# Patient Record
Sex: Female | Born: 1945 | Hispanic: No | Smoking: Former smoker
Health system: Southern US, Community
[De-identification: ages and names within clinical notes are randomized; demographics above are authoritative.]

## PROBLEM LIST (undated history)

## (undated) DIAGNOSIS — K5900 Constipation, unspecified: Secondary | ICD-10-CM

## (undated) DIAGNOSIS — K219 Gastro-esophageal reflux disease without esophagitis: Secondary | ICD-10-CM

## (undated) HISTORY — PX: BACK SURGERY: SHX140

## (undated) HISTORY — PX: CHOLECYSTECTOMY: SHX55

---

## 1997-10-08 ENCOUNTER — Ambulatory Visit (HOSPITAL_COMMUNITY): Admission: RE | Admit: 1997-10-08 | Discharge: 1997-10-08 | Payer: Self-pay | Admitting: Family Medicine

## 1999-01-28 ENCOUNTER — Other Ambulatory Visit: Admission: RE | Admit: 1999-01-28 | Discharge: 1999-01-28 | Payer: Self-pay | Admitting: Family Medicine

## 2000-02-02 ENCOUNTER — Other Ambulatory Visit: Admission: RE | Admit: 2000-02-02 | Discharge: 2000-02-02 | Payer: Self-pay | Admitting: Family Medicine

## 2000-03-09 ENCOUNTER — Encounter: Admission: RE | Admit: 2000-03-09 | Discharge: 2000-03-09 | Payer: Self-pay | Admitting: Family Medicine

## 2000-03-09 ENCOUNTER — Encounter: Payer: Self-pay | Admitting: Family Medicine

## 2001-04-01 ENCOUNTER — Encounter: Payer: Self-pay | Admitting: Family Medicine

## 2001-04-01 ENCOUNTER — Encounter: Admission: RE | Admit: 2001-04-01 | Discharge: 2001-04-01 | Payer: Self-pay | Admitting: Family Medicine

## 2002-04-02 ENCOUNTER — Encounter: Admission: RE | Admit: 2002-04-02 | Discharge: 2002-04-02 | Payer: Self-pay | Admitting: Family Medicine

## 2002-04-02 ENCOUNTER — Encounter: Payer: Self-pay | Admitting: Family Medicine

## 2003-03-31 ENCOUNTER — Other Ambulatory Visit: Admission: RE | Admit: 2003-03-31 | Discharge: 2003-03-31 | Payer: Self-pay | Admitting: Family Medicine

## 2004-03-08 ENCOUNTER — Encounter: Admission: RE | Admit: 2004-03-08 | Discharge: 2004-03-08 | Payer: Self-pay | Admitting: Family Medicine

## 2004-06-17 ENCOUNTER — Ambulatory Visit (HOSPITAL_COMMUNITY): Admission: RE | Admit: 2004-06-17 | Discharge: 2004-06-17 | Payer: Self-pay | Admitting: Gastroenterology

## 2004-12-18 ENCOUNTER — Emergency Department (HOSPITAL_COMMUNITY): Admission: EM | Admit: 2004-12-18 | Discharge: 2004-12-18 | Payer: Self-pay | Admitting: Family Medicine

## 2005-02-14 ENCOUNTER — Encounter: Admission: RE | Admit: 2005-02-14 | Discharge: 2005-02-14 | Payer: Self-pay | Admitting: Family Medicine

## 2005-04-17 ENCOUNTER — Other Ambulatory Visit: Admission: RE | Admit: 2005-04-17 | Discharge: 2005-04-17 | Payer: Self-pay | Admitting: Family Medicine

## 2005-09-28 ENCOUNTER — Encounter: Admission: RE | Admit: 2005-09-28 | Discharge: 2005-09-28 | Payer: Self-pay | Admitting: Family Medicine

## 2005-10-05 ENCOUNTER — Encounter: Admission: RE | Admit: 2005-10-05 | Discharge: 2005-10-05 | Payer: Self-pay | Admitting: Family Medicine

## 2006-04-05 ENCOUNTER — Encounter: Admission: RE | Admit: 2006-04-05 | Discharge: 2006-04-05 | Payer: Self-pay | Admitting: Family Medicine

## 2006-12-17 ENCOUNTER — Encounter: Admission: RE | Admit: 2006-12-17 | Discharge: 2006-12-17 | Payer: Self-pay | Admitting: Family Medicine

## 2007-05-06 ENCOUNTER — Ambulatory Visit (HOSPITAL_COMMUNITY): Admission: RE | Admit: 2007-05-06 | Discharge: 2007-05-06 | Payer: Self-pay | Admitting: Family Medicine

## 2007-11-04 ENCOUNTER — Emergency Department (HOSPITAL_COMMUNITY): Admission: EM | Admit: 2007-11-04 | Discharge: 2007-11-04 | Payer: Self-pay | Admitting: Family Medicine

## 2007-12-18 ENCOUNTER — Emergency Department (HOSPITAL_COMMUNITY): Admission: EM | Admit: 2007-12-18 | Discharge: 2007-12-18 | Payer: Self-pay | Admitting: Emergency Medicine

## 2007-12-19 ENCOUNTER — Emergency Department (HOSPITAL_COMMUNITY): Admission: EM | Admit: 2007-12-19 | Discharge: 2007-12-19 | Payer: Self-pay | Admitting: Emergency Medicine

## 2007-12-30 ENCOUNTER — Encounter: Admission: RE | Admit: 2007-12-30 | Discharge: 2008-01-21 | Payer: Self-pay | Admitting: Neurosurgery

## 2008-01-27 ENCOUNTER — Inpatient Hospital Stay (HOSPITAL_COMMUNITY): Admission: RE | Admit: 2008-01-27 | Discharge: 2008-01-29 | Payer: Self-pay | Admitting: Neurosurgery

## 2008-03-09 ENCOUNTER — Ambulatory Visit (HOSPITAL_COMMUNITY): Admission: RE | Admit: 2008-03-09 | Discharge: 2008-03-09 | Payer: Self-pay | Admitting: Orthopedic Surgery

## 2008-03-11 ENCOUNTER — Encounter: Admission: RE | Admit: 2008-03-11 | Discharge: 2008-03-11 | Payer: Self-pay | Admitting: Family Medicine

## 2008-04-24 ENCOUNTER — Other Ambulatory Visit: Admission: RE | Admit: 2008-04-24 | Discharge: 2008-04-24 | Payer: Self-pay | Admitting: Family Medicine

## 2008-12-24 ENCOUNTER — Ambulatory Visit: Payer: Self-pay | Admitting: Vascular Surgery

## 2009-03-30 ENCOUNTER — Encounter: Admission: RE | Admit: 2009-03-30 | Discharge: 2009-03-30 | Payer: Self-pay | Admitting: Family Medicine

## 2009-06-17 ENCOUNTER — Ambulatory Visit (HOSPITAL_COMMUNITY): Admission: RE | Admit: 2009-06-17 | Discharge: 2009-06-17 | Payer: Self-pay | Admitting: Neurosurgery

## 2009-07-28 ENCOUNTER — Ambulatory Visit (HOSPITAL_COMMUNITY): Admission: RE | Admit: 2009-07-28 | Discharge: 2009-07-28 | Payer: Self-pay | Admitting: Neurosurgery

## 2010-02-27 ENCOUNTER — Encounter: Payer: Self-pay | Admitting: Family Medicine

## 2010-04-26 LAB — CREATININE, SERUM
Creatinine, Ser: 0.81 mg/dL (ref 0.4–1.2)
GFR calc Af Amer: >60 mL/min (ref >60)
GFR calc non Af Amer: >60 mL/min (ref >60)

## 2010-06-21 NOTE — Procedures (Signed)
DUPLEX DEEP VENOUS EXAM - LOWER EXTREMITY   INDICATION:  Right thigh lump and pain.   HISTORY:  Edema:  No.  Trauma/Surgery:  No.  Pain:  Yes.  PE:  No.  Previous DVT:  No.  Anticoagulants:  None.  Other:   DUPLEX EXAM:                CFV   SFV   PopV  PTV    GSV                R  L  R  L  R  L  R   L  R  L  Thrombosis    o  o  o     o     o      o  Spontaneous   +  +  +     +     +      +  Phasic        +  +  +     +     +      +  Augmentation  +  +  +     +     +      +  Compressible  +  +  +     +     +      +  Competent     +  +  +     +     +      +   Legend:  + - yes  o - no  p - partial  D - decreased   IMPRESSION:  No evidence of deep venous thrombosis noted in the right  leg.    _____________________________  Di Kindle. Edilia Bo, M.D.   MG/MEDQ  D:  12/24/2008  T:  12/24/2008  Job:  [id number]

## 2010-06-21 NOTE — Op Note (Signed)
[name], [name]                ACCOUNT NO.:  [id number]   MEDICAL RECORD NO.:  [id number]          PATIENT TYPE:  INP   LOCATION:  3016                         FACILITY:  [facility]   PHYSICIAN:  Hewitt Shorts, M.D.DATE OF BIRTH:  04-06-1945   DATE OF PROCEDURE:  01/27/2008  DATE OF DISCHARGE:                               OPERATIVE REPORT   PREOPERATIVE DIAGNOSES:  1. Left L3-L4 extraforaminal lumbar disk herniation.  2. L3-L4 dynamic degenerative spondylolisthesis grade 1.  3. Lumbar spondylosis.  4. Lumbar radiculopathy.   POSTOPERATIVE DIAGNOSES:  1. Left L3-L4 extraforaminal lumbar disc herniation.  2. L3-L4 dynamic degenerative spondylolisthesis grade 1.  3. Lumbar spondylosis.  4. Lumbar radiculopathy.   PROCEDURE:  Bilateral L3-L4 lumbar laminotomy, facetectomy,  foraminotomy, and microdiskectomy with decompression of the thecal sac  and exiting L3 and L4 nerve roots bilaterally, with decompression beyond  that required for interbody fusion.  Bilateral L3-L4 posterior lumbar  interbody fusion with mosaic with bone marrow aspirate and bilateral L3-  L4 posterolateral arthrodesis with Radius posterior instrumentation and  mosaic with bone marrow aspirate with microdissection.   SURGEON:  Hewitt Shorts, MD   ASSISTANTS:  Webb Silversmith, NP and Channing Mutters, MD   ANESTHESIA:  General endotracheal.   INDICATIONS:  The patient is a 65 year old woman who presented with a  disabling left lumbar radiculopathy, substantial for left L3-L4  extraforaminal lumbar disk herniation with compression of the exiting  left L3 nerve root and weakness of the left iliopsoas and quadriceps.  She also has a dynamic degenerative spondylolisthesis due to her  transverse facet arthropathy, and the decision was made to proceed with  decompression and stabilization.   PROCEDURE:  The patient was brought to the operating room and placed  under general endotracheal anesthesia.  The patient was turned to  a  prone position.  Lumbar region was prepped with Betadine soap and  solution, and draped in a sterile fashion.  The midline was infiltrated  with local anesthetic with epinephrine.  X-ray was taken.  The L3-L4  level identified, and a midline incision made of L3-L4 level is carried  down through the subcutaneous tissue.  Bipolar cautery and  electrocautery were used to maintain hemostasis.  Dissection was carried  down to the lumbar fascia, which was incised bilaterally, and the  paraspinal muscles were dissected from the spinous process and lamina in  a subperiosteal fashion.  Another x-ray was taken, L3-L4 level  identified.  With magnification using microdissection and microsurgical  technique, we proceed with the decompression.  Bilateral laminotomy and  facetectomy were performed using the XMax drill and Kerrison punches.  Ligamentum flavum was carefully removed, and we then identified thecal  sac and exiting L3 and L4 nerve roots bilaterally.  The dissection was  carried out laterally to the left.  The annulus was identified, and the  left L3 nerve root was identified.  We transected the extraforaminal  soft tissues around the annulus and began to identify free fragment disk  herniation compressing the left L3 nerve root.  This was gently  mobilized and removed in a  piecemeal fashion, and in fact, a large free  fragment of disk herniation extraforaminally was dissected from the  surrounding tissues and removed with good decompression of the exiting  left L3 nerve root.   We then proceeded with the bilateral diskectomy incising the annulus  bilaterally, removing spondylitic overgrowth from the posterior aspect  of the vertebral bodies bilaterally and then continued diskectomy using  microcurettes and pituitary rongeurs.  Thorough diskectomy was performed  both in the midline as well laterally, and then, we prepared the  endplates using a gradually increasing size of paddle curette  to remove  the cartilaginous surface.  Once we were down to a good bony surface, we  measured the height of the intervertebral disk space reflected to 13 x  25 mm implants.   We then draped the C-arm fluoroscope and brought into the field and  localized the left L4 pedicle.  It was probed with a pedicle probe, and  bone morrow aspirate was aspirated from the vertebral body.  We injected  this over 15-mL strip of mosaic.  Each of the interbody implants was  then packed with mosaic with bone morrow aspirate.  We carefully  retracted thecal sac and nerve root and then placed the first implant on  the right side.  It was countersunk; then from the left side, we packed  additional mosaic with bone morrow aspirate in the midline and then  again retracted the thecal sac and nerve root.  We placed the second  implant on the left side.  We packed additional mosaic with bone morrow  aspirate laterally to the implants.  Once the interbody fusion was done,  we proceeded with the posterolateral arthrodesis.   Again using the C-arm fluoroscope, the right L4 pedicle as well as the  bilateral L3 pedicles were identified.  Each of the 4 pedicles was  examined with ball probe, good bony surface found.  No cutouts were  found.  Each of the pedicles were tapped with 5.25 mm tap and again  examined with the ball probe.  Good threading was noted.  No cutouts  were found, and then we placed 5.75 x 45 mm screws bilaterally in each  level.  Once all 4 screws were in place, we selected a 45-mm rod for the  right side and 50-mm rod for the left side.  They were placed within the  screw heads and secured with locking caps.  Once all 4 locking caps were  in place, final tightening was done against the counter torque.  At this  time, we decorticated the transverse processes at L3 and L4 bilaterally,  and we packed the remaining mosaic with bone marrow aspirate along with  a small Infuse, placing half of the Infuse  on the left side, the other  half on the right side along with the remaining mosaic with bone morrow  aspirates over the transverse process and intertransverse space.  We  carefully examined to ensure that none of the bone graft material came  into the spinal canal or neuroforamen, and there was no encroachment on  the thecal sac or nerve roots bilaterally.  Once the bone graft was in  place, we proceed with closure.  The wound had been irrigated numerous  times throughout the procedure with saline solution as well as  bacitracin solution.  The paraspinal muscles were approximated with  interrupted undyed #1 Vicryl sutures.  The deep fascia was closed with  interrupted undyed #1 Vicryl sutures.  The  subcutaneous and subcuticular  were closed with interrupted inverted 2-0 and 3-0 undyed Vicryl sutures,  and the skin was approximated with Dermabond.  The wound was dressed  with Adaptic and sterile gauze.  The procedure was tolerated well.  The  estimated blood loss was 100 mL.  We did use a Cell Saver during the  procedure, but there was insufficient blood loss to  process the  collected blood within the Cell Saver.  Sponge and needle count were  correct.  Following surgery, the patient was turned back to the supine  position to be reversed from the anesthetic, extubated, and transferred  to the recovery room for further care.      Hewitt Shorts, M.D.  Electronically Signed    RWN/MEDQ  D:  01/27/2008  T:  01/28/2008  Job:  [id number]

## 2010-06-24 NOTE — Op Note (Signed)
NAME:  [name], [name]                ACCOUNT NO.:  [id number]   MEDICAL RECORD NO.:  [id number]          PATIENT TYPE:  AMB   LOCATION:  ENDO                         FACILITY:  [facility]   PHYSICIAN:  Graylin Shiver, M.D.   DATE OF BIRTH:  [birth year]   DATE OF PROCEDURE:  06/17/2004  DATE OF DISCHARGE:                                 OPERATIVE REPORT   PROCEDURE:  Colonoscopy.   INDICATION:  Family history of colon polyps in the patient's grandmother.   Informed consent was obtained after explanation of the risks of bleeding,  infection. and perforation.   PREMEDICATION:  1.  Fentanyl 75 mcg IV.  2.  Versed 7 mg IV.   PROCEDURE:  With the patient in the left lateral decubitus position, a  rectal exam was performed.  No masses were felt.  The Olympus colonoscope  was inserted into the rectum and advanced around the colon to the cecum.  Cecal landmarks were identified.  The cecum and ascending colon were normal.  The transverse colon normal.  The descending colon, sigmoid, and rectum were  normal.  She tolerated the procedure well without complications.   IMPRESSION:  Normal colonoscopy to the cecum.      SFG/MEDQ  D:  06/17/2004  T:  06/17/2004  Job:  [id number]   cc:   Bryan Lemma. Manus Gunning, M.D.  [address] [place]  [place] [address]  Fax: [phone]

## 2010-06-27 ENCOUNTER — Other Ambulatory Visit: Payer: Self-pay | Admitting: Family Medicine

## 2010-06-27 DIAGNOSIS — Z1231 Encounter for screening mammogram for malignant neoplasm of breast: Secondary | ICD-10-CM

## 2010-07-06 ENCOUNTER — Ambulatory Visit
Admission: RE | Admit: 2010-07-06 | Discharge: 2010-07-06 | Disposition: A | Discharge status: Home / Self Care | Payer: 59 | Source: Ambulatory Visit | Attending: Family Medicine | Admitting: Family Medicine

## 2010-07-06 DIAGNOSIS — Z1231 Encounter for screening mammogram for malignant neoplasm of breast: Secondary | ICD-10-CM

## 2010-07-15 IMAGING — CT CT L SPINE W/ CM
2 of 13 series · 3 of 20 positions shown, 4 images · IV contrast (omnipaque)
Comparison: Lumbar MRI 06/17/2009. CT 06/17/2009.

MYELOGRAM LUMBAR

CLINICAL DATA: 64-year-old female status post L3-L4 fusion in 0113.
Subsequent development of pain radiating down the medial left lower
extremity.  Question L4 versus L5 radiculitis.
TECHNIQUE: Intrathecal contrast was administered by Dr. Jaylon
Jim  via lumbar puncture at the L1-L2 level. Following
injection of intrathecal Omnipaque contrast, spine imaging in
multiple projections was performed using fluoroscopy.

Fluoroscopy Time: 2.5 minutes.
TECHNIQUE: CT imaging of the lumbar spine was performed after
intrathecal contrast administration.  Multiplanar CT image
reconstructions were also generated.

[Series 104: coronal l-spine · coronal · 0.43mm/px · 1 of 52 slices shown]
[im 26/52  bone]
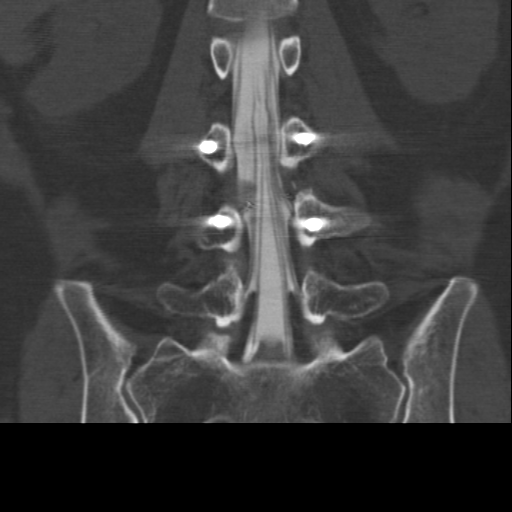

[Series 105: axial l-spine · axial · 0.27mm/px · z∈[-403,-172]mm · 2 of 79 slices shown, 3 images]
[im 1/79  soft-tissue]
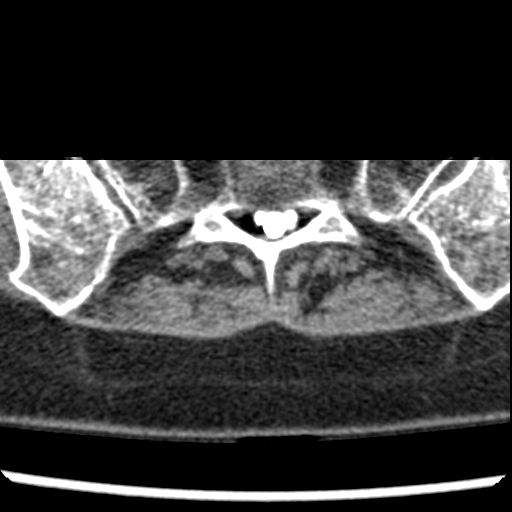
[im 1/79  bone]
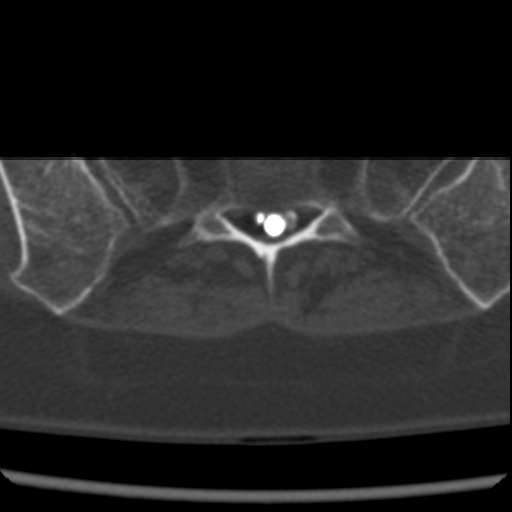
[im 79/79  bone]
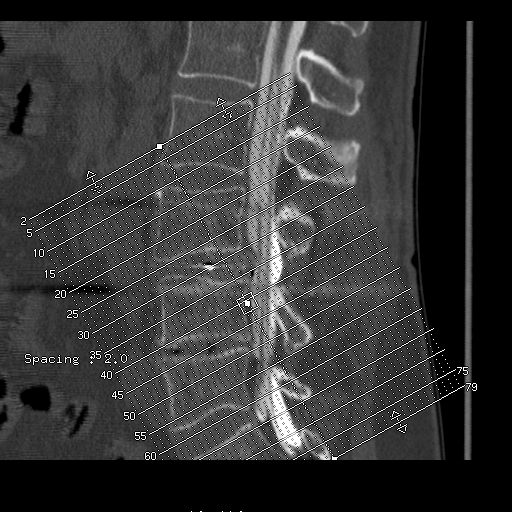

[3 of 20 positions shown; findings below may reference images not displayed]

FINDINGS: Good intrathecal contrast opacification.  Postoperative
changes at L3-L4, see CT findings below.  Asymmetric blunting of
the left L5 nerve root sleeve (image 5).  Mild ventral defects on
the thecal sac at L2-L3 and L3-L4.  Mild scoliosis.
IMPRESSION: 1.  Postoperative changes at L3-L4 with mild ventral defect on the
thecal sac at this level.
2.  Asymmetric blunting of contrast in the left L5 nerve root
sleeve.
3. See post myelogram CT findings below.

CT MYELOGRAPHY LUMBAR SPINE
FINDINGS: Stable lumbar vertebral body height alignment.
Osteopenia.  Visualized sacrum and SI joints within normal limits.
Visualized abdominal and pelvic viscera are within normal limits
for age; minor calcified atherosclerosis.  Good intrathecal
contrast opacification.  Conus medullaris unremarkable at L1.

L1-L2:  Faint mixed injection incidentally noted.  Negative.

L2-L3:  Mild broad-based disc bulge.  Moderate facet hypertrophy.
Stable thecal sac patency without significant stenosis.

L3-L4:  Sequelae of decompression, sequelae of decompression,
interbody, and posterior fusion.  Mild posterior displacement of
the interbody implant is re-identified and appears stable.  This
mildly indents the ventral thecal sac, more so on the right (series
three image 42).  There is bone formation within the implant, but
only scant solid bridging bone is identified posteriorly in the
central interspace (series 400 image 25). Lucency through the
interspace appears increased from the prior CT.  No posterior
element arthrodesis.  Transpedicular hardware intact with no
adverse features.  No spinal stenosis.  No definite foraminal
stenosis.

L4-L5:  Right eccentric endplate osteophytosis and disc space
narrowing, but no focal or significant disc herniation is
identified.  Mild to moderate right greater than left facet
hypertrophy.  No spinal stenosis.  The asymmetric contrast blunting
from the left L5 nerve root sleeve correspond images 60 and 61 of
series 3.  Near the lateral recess appears patent, and no causative
lesion is identified.  There is no left L4 foraminal stenosis.
There is stable mild right L4 foraminal stenosis due to spurring.

L5-S1:  Mild facet hypertrophy.  Otherwise negative.
IMPRESSION: 1.  Fusion and decompression sequelae of L3-L4 re-identified.  On
today's exam, there is less convincing evidence of interbody
bridging bone.  Stable posterior displacement of the interbody
implant.
2.  Disc osteophyte complex and facet degeneration right greater
than left at L4-L5.  No focal left side L4 or L5 neural impingement
identified despite the asymmetric blunting of contrast from the
left L5 nerve root sleeve (series 3 images 60 and 61). Stable mild
right L4 foraminal stenosis.

## 2010-09-06 ENCOUNTER — Other Ambulatory Visit (HOSPITAL_COMMUNITY): Payer: Self-pay | Admitting: Neurosurgery

## 2010-09-06 DIAGNOSIS — M545 Low back pain, unspecified: Secondary | ICD-10-CM

## 2010-09-06 DIAGNOSIS — M5126 Other intervertebral disc displacement, lumbar region: Secondary | ICD-10-CM

## 2010-09-06 DIAGNOSIS — M4310 Spondylolisthesis, site unspecified: Secondary | ICD-10-CM

## 2010-09-06 DIAGNOSIS — M47817 Spondylosis without myelopathy or radiculopathy, lumbosacral region: Secondary | ICD-10-CM

## 2010-09-06 DIAGNOSIS — IMO0002 Reserved for concepts with insufficient information to code with codable children: Secondary | ICD-10-CM

## 2010-09-23 ENCOUNTER — Other Ambulatory Visit (HOSPITAL_COMMUNITY): Payer: 59

## 2010-09-23 ENCOUNTER — Ambulatory Visit (HOSPITAL_COMMUNITY)
Admission: RE | Admit: 2010-09-23 | Discharge: 2010-09-23 | Disposition: A | Discharge status: Home / Self Care | Payer: 59 | Source: Ambulatory Visit | Attending: Neurosurgery | Admitting: Neurosurgery

## 2010-09-23 DIAGNOSIS — M5126 Other intervertebral disc displacement, lumbar region: Secondary | ICD-10-CM

## 2010-09-23 DIAGNOSIS — Z981 Arthrodesis status: Secondary | ICD-10-CM | POA: Insufficient documentation

## 2010-09-23 DIAGNOSIS — IMO0002 Reserved for concepts with insufficient information to code with codable children: Secondary | ICD-10-CM

## 2010-09-23 DIAGNOSIS — M47817 Spondylosis without myelopathy or radiculopathy, lumbosacral region: Secondary | ICD-10-CM

## 2010-09-23 DIAGNOSIS — M545 Low back pain, unspecified: Secondary | ICD-10-CM | POA: Insufficient documentation

## 2010-09-23 DIAGNOSIS — M51379 Other intervertebral disc degeneration, lumbosacral region without mention of lumbar back pain or lower extremity pain: Secondary | ICD-10-CM | POA: Insufficient documentation

## 2010-09-23 DIAGNOSIS — M5137 Other intervertebral disc degeneration, lumbosacral region: Secondary | ICD-10-CM | POA: Insufficient documentation

## 2010-09-23 DIAGNOSIS — M129 Arthropathy, unspecified: Secondary | ICD-10-CM | POA: Insufficient documentation

## 2010-09-23 DIAGNOSIS — M4310 Spondylolisthesis, site unspecified: Secondary | ICD-10-CM

## 2010-10-03 ENCOUNTER — Inpatient Hospital Stay (HOSPITAL_COMMUNITY): Payer: 59

## 2010-10-03 ENCOUNTER — Ambulatory Visit (HOSPITAL_COMMUNITY)
Admission: RE | Admit: 2010-10-03 | Discharge: 2010-10-04 | Disposition: A | Discharge status: Home / Self Care | Payer: 59 | Source: Ambulatory Visit | Attending: Neurosurgery | Admitting: Neurosurgery

## 2010-10-03 DIAGNOSIS — Y838 Other surgical procedures as the cause of abnormal reaction of the patient, or of later complication, without mention of misadventure at the time of the procedure: Secondary | ICD-10-CM | POA: Insufficient documentation

## 2010-10-03 DIAGNOSIS — T8489XA Other specified complication of internal orthopedic prosthetic devices, implants and grafts, initial encounter: Secondary | ICD-10-CM | POA: Insufficient documentation

## 2010-10-03 DIAGNOSIS — K219 Gastro-esophageal reflux disease without esophagitis: Secondary | ICD-10-CM | POA: Insufficient documentation

## 2010-10-03 LAB — TYPE AND SCREEN
ABO/RH(D): A POS
Antibody Screen: NEGATIVE

## 2010-10-03 LAB — CBC
HCT: 38 % (ref 36.0–46.0)
Hemoglobin: 13 g/dL (ref 12.0–15.0)
MCH: 30.3 pg (ref 26.0–34.0)
MCHC: 34.2 g/dL (ref 30.0–36.0)
MCV: 88.6 fL (ref 78.0–100.0)
Platelets: 205 10*3/uL (ref 150–400)
RBC: 4.29 MIL/uL (ref 3.87–5.11)
RDW: 13.2 % (ref 11.5–15.5)
WBC: 3.4 10*3/uL — ABNORMAL LOW (ref 4.0–10.5)

## 2010-10-03 LAB — SURGICAL PCR SCREEN
MRSA, PCR: NEGATIVE
Staphylococcus aureus: POSITIVE — AB

## 2010-10-05 NOTE — Op Note (Signed)
NAMELAQUETA, [name] NO.:  [id number]  MEDICAL RECORD NO.:  [id number]  LOCATION:  3528                         FACILITY:  [facility]  PHYSICIAN:  Hewitt Shorts, M.D.DATE OF BIRTH:  19-Jul-1945  DATE OF PROCEDURE:  10/03/2010 DATE OF DISCHARGE:                              OPERATIVE REPORT   PREOPERATIVE DIAGNOSES:  Retropositioning of the interbody implants, left lumbar radiculopathy, possible pseudoarthrosis.  POSTOPERATIVE DIAGNOSES:  Retropositioning of the interbody implants, left lumbar radiculopathy, possible pseudoarthrosis.  PROCEDURE:  Exploration of lumbar fusion, left L3-4 lumbar laminotomy, foraminotomy, facetectomy with microdissection, microsurgical technique in the operating microscope with partial removal of the left L3-4 interbody implant (posterior portion) with decompression of the left L3 and left L4 nerve roots.  SURGEON:  Hewitt Shorts, MD  ASSISTANT:  Clydene Fake, MDANESTHESIA:  General endotracheal.  INDICATIONS:  This is a 65 year old woman who underwent an L3-4 lumbar decompression plus and PLA in 2009.  Postoperative x-ray showed retropositioning of the interbody implants, but the patient having difficulties with left lumbar radiculopathy.  She has been studied several times over the past few years and because of persistent pain, decision was made to proceed with exploration.  There was question of the extensive fusion and therefore plan to explore the fusion determine whether supplemental arthrodesis needed to be performed and proceed with laminotomy, foraminotomy, facetectomy, and partial resection of the interbody implants.  PROCEDURE:  The patient was brought to the operating room and placed on general endotracheal anesthesia.  The patient was turned to prone position, lumbar region was prepped with Betadine soap and solution and draped in a sterile fashion.  The midline was infiltrated with local anesthetic  with epinephrine.  An x-ray was taken, and we localized the L3-4 level lateral to the previous midline incision, and a portion of the previous midline incision was reopened.  Dissection was carried down to the subcutaneous tissue to the deep fascia, which was incised on left side of midline, paraspinal muscles were dissected laterally, and we were able to palpate the head to the pedicle screws on the left side. The overlying scar tissue was dissected from that, and we identified the pedicle screws and the intervening rod.  There was bone growth that has occurred in and around the screws and rod, and this was removed using osteotome and curettes.  We further examined the fusion and felt that there was a solid fusion at L3-4.  We then draped the microscope and was brought into field for additional navigation, illumination, and visualization, and the decompression was performed using microdissection microsurgical technique.  We defined the facet of L4 and the lamina of L4 and subsequently the lamina of L3.  We then used the high-speed drill to extend the previous laminotomy and facetectomy.  We identified the pedicle of L4 and then the pedicle of L3, and there was extensive scar tissue in the previous laminotomy.  We were able to slowly expose the L4 nerve roots and the L3 nerve roots, and then we were able to identify where the disk space had been and identified the PEEK interbody implants.  We then separated scar tissue from the implants, and  the ventral aspect of thecal sac and gradually mobilize the thecal sac medially.  When this was adequately mobilized, we could see how the superolateral posterior corner of the PEEK implant was impinging on the left L3 nerve as well as the entire posterior aspect of the implant causing impression upon the thecal sac, and the L4 nerve root as was beginning to branch off.  Using two D'Errico retractors, one for the thecal sac and L4 nerve root in the  other for the L3 nerve root.  We then began to use the high-speed drill to remove the posterior aspect of the left L3-4 PEEK interbody implants.  This was done in a gradual fashion with extensive irrigation and able to smooth down the posterior aspect of the implants and separate the superolateral posterior aspect of the implant impinging on the nerve.  This was removed decompressing the nerve and the thecal sac and left L4 nerve root was decompressed as well as having decompressed the L3 nerve.  Once the posterior aspect of the implant had been safely removed, the wound was irrigated with bacitracin solution.  Hemostasis established with the use of bipolar cautery and Gelfoam with thrombin. The Gelfoam was gently irrigated away, and the hemostasis confirmed. Once the decompression was completed, hemostasis was established.  We proceeded with closure.  Deep fascia was closed with interrupted undyed 1 Vicryl sutures.  Scarpa fascia was closed with interrupted undyed 1 Vicryl sutures.  Subcutaneous and subcuticular were closed with interrupted inverted 2-0 undyed Vicryl sutures.  Skin was approximated Dermabond.  The procedure was tolerated well.  Estimated blood loss was 100 mL.  Sponge and needle count were correct.  Following surgery, the patient was turned back to the supine position to be reversed from the anesthetic, extubated, and transferred to the recovery room for further care where she was noted to be moving all four extremities to command.     Hewitt Shorts, M.D.     RWN/MEDQ  D:  10/03/2010  T:  10/03/2010  Job:  [id number]  Electronically Signed by Shirlean Kelly M.D. on 10/05/2010 10:12:58 AM

## 2010-11-11 LAB — BASIC METABOLIC PANEL
BUN: 10 mg/dL (ref 6–23)
CO2: 26 mEq/L (ref 19–32)
Calcium: 9.8 mg/dL (ref 8.4–10.5)
Chloride: 105 mEq/L (ref 96–112)
Creatinine, Ser: 0.8 mg/dL (ref 0.4–1.2)
GFR calc Af Amer: >60 mL/min (ref >60)
GFR calc non Af Amer: >60 mL/min (ref >60)
Glucose, Bld: 105 mg/dL — ABNORMAL HIGH (ref 70–99)
Potassium: 4 mEq/L (ref 3.5–5.1)
Sodium: 140 mEq/L (ref 135–145)

## 2010-11-11 LAB — CBC
HCT: 40.5 % (ref 36.0–46.0)
Hemoglobin: 13.5 g/dL (ref 12.0–15.0)
MCHC: 33.4 g/dL (ref 30.0–36.0)
MCV: 92 fL (ref 78.0–100.0)
Platelets: 286 10*3/uL (ref 150–400)
RBC: 4.4 MIL/uL (ref 3.87–5.11)
RDW: 13.9 % (ref 11.5–15.5)
WBC: 4.6 10*3/uL (ref 4.0–10.5)

## 2010-11-11 LAB — ABO/RH: ABO/RH(D): A POS

## 2010-11-11 LAB — TYPE AND SCREEN
ABO/RH(D): A POS
Antibody Screen: NEGATIVE

## 2011-06-13 ENCOUNTER — Other Ambulatory Visit: Payer: Self-pay | Admitting: Family Medicine

## 2011-06-13 ENCOUNTER — Other Ambulatory Visit (HOSPITAL_COMMUNITY)
Admission: RE | Admit: 2011-06-13 | Discharge: 2011-06-13 | Disposition: A | Discharge status: Home / Self Care | Payer: Medicare Other | Source: Ambulatory Visit | Attending: Family Medicine | Admitting: Family Medicine

## 2011-06-13 DIAGNOSIS — Z124 Encounter for screening for malignant neoplasm of cervix: Secondary | ICD-10-CM | POA: Insufficient documentation

## 2011-06-13 DIAGNOSIS — Z1231 Encounter for screening mammogram for malignant neoplasm of breast: Secondary | ICD-10-CM

## 2011-07-10 ENCOUNTER — Ambulatory Visit
Admission: RE | Admit: 2011-07-10 | Discharge: 2011-07-10 | Disposition: A | Discharge status: Home / Self Care | Payer: Medicare Other | Source: Ambulatory Visit | Attending: Family Medicine | Admitting: Family Medicine

## 2011-07-10 DIAGNOSIS — Z1231 Encounter for screening mammogram for malignant neoplasm of breast: Secondary | ICD-10-CM

## 2012-08-22 ENCOUNTER — Other Ambulatory Visit: Payer: Self-pay

## 2012-08-22 DIAGNOSIS — Z1231 Encounter for screening mammogram for malignant neoplasm of breast: Secondary | ICD-10-CM

## 2012-09-10 ENCOUNTER — Ambulatory Visit
Admission: RE | Admit: 2012-09-10 | Discharge: 2012-09-10 | Disposition: A | Discharge status: Home / Self Care | Payer: Medicare Other | Source: Ambulatory Visit

## 2012-09-10 DIAGNOSIS — Z1231 Encounter for screening mammogram for malignant neoplasm of breast: Secondary | ICD-10-CM

## 2014-08-04 ENCOUNTER — Other Ambulatory Visit: Payer: Self-pay

## 2014-08-04 DIAGNOSIS — Z1231 Encounter for screening mammogram for malignant neoplasm of breast: Secondary | ICD-10-CM

## 2014-08-06 ENCOUNTER — Ambulatory Visit
Admission: RE | Admit: 2014-08-06 | Discharge: 2014-08-06 | Disposition: A | Discharge status: Home / Self Care | Payer: Medicare Other | Source: Ambulatory Visit

## 2014-08-06 DIAGNOSIS — Z1231 Encounter for screening mammogram for malignant neoplasm of breast: Secondary | ICD-10-CM

## 2014-08-12 ENCOUNTER — Other Ambulatory Visit: Payer: Self-pay | Admitting: Gastroenterology

## 2015-02-09 DIAGNOSIS — J069 Acute upper respiratory infection, unspecified: Secondary | ICD-10-CM | POA: Diagnosis not present

## 2015-06-28 DIAGNOSIS — M5416 Radiculopathy, lumbar region: Secondary | ICD-10-CM | POA: Diagnosis not present

## 2015-06-28 DIAGNOSIS — M899 Disorder of bone, unspecified: Secondary | ICD-10-CM | POA: Diagnosis not present

## 2015-06-28 DIAGNOSIS — Z0000 Encounter for general adult medical examination without abnormal findings: Secondary | ICD-10-CM | POA: Diagnosis not present

## 2015-06-28 DIAGNOSIS — K219 Gastro-esophageal reflux disease without esophagitis: Secondary | ICD-10-CM | POA: Diagnosis not present

## 2015-06-28 DIAGNOSIS — E559 Vitamin D deficiency, unspecified: Secondary | ICD-10-CM | POA: Diagnosis not present

## 2015-06-28 DIAGNOSIS — E7800 Pure hypercholesterolemia, unspecified: Secondary | ICD-10-CM | POA: Diagnosis not present

## 2015-06-28 DIAGNOSIS — Z86010 Personal history of colonic polyps: Secondary | ICD-10-CM | POA: Diagnosis not present

## 2015-08-09 ENCOUNTER — Other Ambulatory Visit: Payer: Self-pay | Admitting: Family Medicine

## 2015-08-09 DIAGNOSIS — Z1231 Encounter for screening mammogram for malignant neoplasm of breast: Secondary | ICD-10-CM

## 2015-08-18 DIAGNOSIS — M8588 Other specified disorders of bone density and structure, other site: Secondary | ICD-10-CM | POA: Diagnosis not present

## 2015-08-20 ENCOUNTER — Ambulatory Visit
Admission: RE | Admit: 2015-08-20 | Discharge: 2015-08-20 | Disposition: A | Discharge status: Home / Self Care | Payer: Medicare HMO | Source: Ambulatory Visit | Attending: Family Medicine | Admitting: Family Medicine

## 2015-08-20 DIAGNOSIS — Z1231 Encounter for screening mammogram for malignant neoplasm of breast: Secondary | ICD-10-CM | POA: Diagnosis not present

## 2016-01-05 DIAGNOSIS — W540XXA Bitten by dog, initial encounter: Secondary | ICD-10-CM | POA: Diagnosis not present

## 2016-01-05 DIAGNOSIS — T148XXA Other injury of unspecified body region, initial encounter: Secondary | ICD-10-CM | POA: Diagnosis not present

## 2016-01-05 DIAGNOSIS — Z23 Encounter for immunization: Secondary | ICD-10-CM | POA: Diagnosis not present

## 2016-04-07 DIAGNOSIS — H5203 Hypermetropia, bilateral: Secondary | ICD-10-CM | POA: Diagnosis not present

## 2016-04-07 DIAGNOSIS — H2513 Age-related nuclear cataract, bilateral: Secondary | ICD-10-CM | POA: Diagnosis not present

## 2016-10-12 DIAGNOSIS — Z86010 Personal history of colonic polyps: Secondary | ICD-10-CM | POA: Diagnosis not present

## 2016-10-12 DIAGNOSIS — Z23 Encounter for immunization: Secondary | ICD-10-CM | POA: Diagnosis not present

## 2016-10-12 DIAGNOSIS — E7800 Pure hypercholesterolemia, unspecified: Secondary | ICD-10-CM | POA: Diagnosis not present

## 2016-10-12 DIAGNOSIS — K219 Gastro-esophageal reflux disease without esophagitis: Secondary | ICD-10-CM | POA: Diagnosis not present

## 2016-10-12 DIAGNOSIS — M5416 Radiculopathy, lumbar region: Secondary | ICD-10-CM | POA: Diagnosis not present

## 2016-10-12 DIAGNOSIS — Z1389 Encounter for screening for other disorder: Secondary | ICD-10-CM | POA: Diagnosis not present

## 2016-10-12 DIAGNOSIS — E559 Vitamin D deficiency, unspecified: Secondary | ICD-10-CM | POA: Diagnosis not present

## 2016-10-12 DIAGNOSIS — M899 Disorder of bone, unspecified: Secondary | ICD-10-CM | POA: Diagnosis not present

## 2016-10-12 DIAGNOSIS — Z0000 Encounter for general adult medical examination without abnormal findings: Secondary | ICD-10-CM | POA: Diagnosis not present

## 2017-10-18 ENCOUNTER — Other Ambulatory Visit: Payer: Self-pay | Admitting: Family Medicine

## 2017-10-18 DIAGNOSIS — M899 Disorder of bone, unspecified: Secondary | ICD-10-CM | POA: Diagnosis not present

## 2017-10-18 DIAGNOSIS — E559 Vitamin D deficiency, unspecified: Secondary | ICD-10-CM | POA: Diagnosis not present

## 2017-10-18 DIAGNOSIS — Z1389 Encounter for screening for other disorder: Secondary | ICD-10-CM | POA: Diagnosis not present

## 2017-10-18 DIAGNOSIS — E7800 Pure hypercholesterolemia, unspecified: Secondary | ICD-10-CM | POA: Diagnosis not present

## 2017-10-18 DIAGNOSIS — Z86010 Personal history of colonic polyps: Secondary | ICD-10-CM | POA: Diagnosis not present

## 2017-10-18 DIAGNOSIS — Z0000 Encounter for general adult medical examination without abnormal findings: Secondary | ICD-10-CM | POA: Diagnosis not present

## 2017-10-18 DIAGNOSIS — Z1239 Encounter for other screening for malignant neoplasm of breast: Secondary | ICD-10-CM | POA: Diagnosis not present

## 2017-10-18 DIAGNOSIS — K219 Gastro-esophageal reflux disease without esophagitis: Secondary | ICD-10-CM | POA: Diagnosis not present

## 2017-10-18 DIAGNOSIS — Z1231 Encounter for screening mammogram for malignant neoplasm of breast: Secondary | ICD-10-CM

## 2017-10-18 DIAGNOSIS — Z23 Encounter for immunization: Secondary | ICD-10-CM | POA: Diagnosis not present

## 2017-10-18 DIAGNOSIS — M5416 Radiculopathy, lumbar region: Secondary | ICD-10-CM | POA: Diagnosis not present

## 2017-11-16 ENCOUNTER — Ambulatory Visit
Admission: RE | Admit: 2017-11-16 | Discharge: 2017-11-16 | Disposition: A | Discharge status: Home / Self Care | Payer: Medicare HMO | Source: Ambulatory Visit | Attending: Family Medicine | Admitting: Family Medicine

## 2017-11-16 DIAGNOSIS — Z1231 Encounter for screening mammogram for malignant neoplasm of breast: Secondary | ICD-10-CM | POA: Diagnosis not present

## 2017-11-26 DIAGNOSIS — M8588 Other specified disorders of bone density and structure, other site: Secondary | ICD-10-CM | POA: Diagnosis not present

## 2018-04-10 DIAGNOSIS — H2513 Age-related nuclear cataract, bilateral: Secondary | ICD-10-CM | POA: Diagnosis not present

## 2018-04-10 DIAGNOSIS — H5203 Hypermetropia, bilateral: Secondary | ICD-10-CM | POA: Diagnosis not present

## 2018-10-21 DIAGNOSIS — E7800 Pure hypercholesterolemia, unspecified: Secondary | ICD-10-CM | POA: Diagnosis not present

## 2018-10-21 DIAGNOSIS — Z0000 Encounter for general adult medical examination without abnormal findings: Secondary | ICD-10-CM | POA: Diagnosis not present

## 2018-10-21 DIAGNOSIS — M899 Disorder of bone, unspecified: Secondary | ICD-10-CM | POA: Diagnosis not present

## 2018-10-21 DIAGNOSIS — E559 Vitamin D deficiency, unspecified: Secondary | ICD-10-CM | POA: Diagnosis not present

## 2018-10-21 DIAGNOSIS — Z86010 Personal history of colonic polyps: Secondary | ICD-10-CM | POA: Diagnosis not present

## 2018-10-21 DIAGNOSIS — M5416 Radiculopathy, lumbar region: Secondary | ICD-10-CM | POA: Diagnosis not present

## 2018-10-21 DIAGNOSIS — K219 Gastro-esophageal reflux disease without esophagitis: Secondary | ICD-10-CM | POA: Diagnosis not present

## 2018-10-21 DIAGNOSIS — Z1389 Encounter for screening for other disorder: Secondary | ICD-10-CM | POA: Diagnosis not present

## 2018-10-28 DIAGNOSIS — R69 Illness, unspecified: Secondary | ICD-10-CM | POA: Diagnosis not present

## 2019-03-09 ENCOUNTER — Ambulatory Visit: Payer: Medicare HMO

## 2019-03-16 ENCOUNTER — Ambulatory Visit: Payer: Medicare HMO | Attending: Internal Medicine

## 2019-03-16 DIAGNOSIS — Z23 Encounter for immunization: Secondary | ICD-10-CM | POA: Insufficient documentation

## 2019-03-16 NOTE — Progress Notes (Signed)
   Covid-19 Vaccination Clinic  Name:  [name]    MRN: [id number] DOB: 29-Mar-1945  03/16/2019  [name] was observed post Covid-19 immunization for 15 minutes without incidence. She was provided with Vaccine Information Sheet and instruction to access the V-Safe system.   [name] was instructed to call 911 with any severe reactions post vaccine: Marland Kitchen Difficulty breathing  . Swelling of your face and throat  . A fast heartbeat  . A bad rash all over your body  . Dizziness and weakness    Immunizations Administered    Name Date Dose VIS Date Route   Pfizer COVID-19 Vaccine 03/16/2019 10:11 AM 0.3 mL 01/17/2019 Intramuscular   Manufacturer: [employer], [employer]   Lot: [id number]   NDC: [address]-5

## 2019-03-20 ENCOUNTER — Ambulatory Visit: Payer: Medicare HMO

## 2019-04-08 ENCOUNTER — Ambulatory Visit: Payer: Medicare HMO | Attending: Internal Medicine

## 2019-04-08 DIAGNOSIS — Z23 Encounter for immunization: Secondary | ICD-10-CM | POA: Insufficient documentation

## 2019-04-08 NOTE — Progress Notes (Signed)
   Covid-19 Vaccination Clinic  Name:  [name]    MRN: [id number] DOB: 1945-07-08  04/08/2019  [name] was observed post Covid-19 immunization for 15 minutes without incident. She was provided with Vaccine Information Sheet and instruction to access the V-Safe system.   [name] was instructed to call 911 with any severe reactions post vaccine: Marland Kitchen Difficulty breathing  . Swelling of face and throat  . A fast heartbeat  . A bad rash all over body  . Dizziness and weakness   Immunizations Administered    Name Date Dose VIS Date Route   Pfizer COVID-19 Vaccine 04/08/2019 11:51 AM 0.3 mL 01/17/2019 Intramuscular   Manufacturer: [employer], [employer]   Lot: [id number]   NDC: [address]-2

## 2019-04-09 ENCOUNTER — Ambulatory Visit: Payer: Medicare HMO

## 2019-05-22 ENCOUNTER — Ambulatory Visit: Payer: Medicare HMO | Admitting: Family Medicine

## 2019-05-26 ENCOUNTER — Ambulatory Visit (INDEPENDENT_AMBULATORY_CARE_PROVIDER_SITE_OTHER): Payer: Medicare HMO

## 2019-05-26 ENCOUNTER — Other Ambulatory Visit: Payer: Self-pay

## 2019-05-26 ENCOUNTER — Encounter: Payer: Self-pay | Admitting: Family Medicine

## 2019-05-26 ENCOUNTER — Ambulatory Visit: Payer: Medicare HMO | Admitting: Family Medicine

## 2019-05-26 VITALS — BP 110/60 | HR 79 | Ht 69.5 in | Wt 178.0 lb

## 2019-05-26 DIAGNOSIS — M79672 Pain in left foot: Secondary | ICD-10-CM

## 2019-05-26 DIAGNOSIS — S76112A Strain of left quadriceps muscle, fascia and tendon, initial encounter: Secondary | ICD-10-CM

## 2019-05-26 DIAGNOSIS — M545 Low back pain: Secondary | ICD-10-CM | POA: Diagnosis not present

## 2019-05-26 DIAGNOSIS — M5442 Lumbago with sciatica, left side: Secondary | ICD-10-CM

## 2019-05-26 DIAGNOSIS — G8929 Other chronic pain: Secondary | ICD-10-CM

## 2019-05-26 NOTE — Progress Notes (Signed)
[name] [address] [place] [address] Phone: [phone] Subjective:   [name], am serving as a scribe for Dr. Antoine Crawford. This visit occurred during the SARS-CoV-2 public health emergency.  Safety protocols were in place, including screening questions prior to the visit, additional usage of staff PPE, and extensive cleaning of exam room while observing appropriate contact time as indicated for disinfecting solutions.   I'm seeing this patient by the request  of:  Crawford, Inger, MD  CC: Leg pain  DXI:[id number]  [name] is a 74 y.o. female coming in with complaint of left leg pain for 2 weeks. Patient states she was doing a hamstring curl at an exercise class and felt a stabbing pain in her quads. Also has L3 back issue. Had back surgery in 2009 and 2011. Pain is improving but still has pain in the quad.       Social History   Socioeconomic History  . Marital status: Widowed    Spouse name: Not on file  . Number of children: Not on file  . Years of education: Not on file  . Highest education level: Not on file  Occupational History  . Not on file  Tobacco Use  . Smoking status: Not on file  Substance and Sexual Activity  . Alcohol use: Not on file  . Drug use: Not on file  . Sexual activity: Not on file  Other Topics Concern  . Not on file  Social History Narrative  . Not on file   Social Determinants of Health   Financial Resource Strain:   . Difficulty of Paying Living Expenses:   Food Insecurity:   . Worried About [name] in the Last Year:   . Barista in the Last Year:   Transportation Needs:   . Freight forwarder (Medical):   Marland Kitchen Lack of Transportation (Non-Medical):   Physical Activity:   . Days of Exercise per Week:   . Minutes of Exercise per Session:   Stress:   . Feeling of Stress :   Social Connections:   . Frequency of Communication with Friends and Family:   .  Frequency of Social Gatherings with Friends and Family:   . Attends Religious Services:   . Active Member of Clubs or Organizations:   . Attends Banker Meetings:   Marland Kitchen Marital Status:    Not on File no known drug allergies No family history on file.     Current Outpatient Medications (Analgesics):  .  ibuprofen (ADVIL) 200 MG tablet, Take 200 mg by mouth in the morning and at bedtime. .  nabumetone (RELAFEN) 500 MG tablet, Take 500 mg by mouth daily.   Current Outpatient Medications (Other):  .  bisacodyl (DULCOLAX) 5 MG EC tablet, Take 5 mg by mouth daily as needed for moderate constipation. .  cholecalciferol (VITAMIN D3) 25 MCG (1000 UNIT) tablet, Take 1,000 Units by mouth daily. .  magnesium gluconate (MAGONATE) 500 MG tablet, Take 500 mg by mouth daily. .  Melatonin 10 MG TABS, Take by mouth. .  pantoprazole (PROTONIX) 40 MG tablet, Take 40 mg by mouth daily. .  pregabalin (LYRICA) 150 MG capsule, Take 150 mg by mouth 2 (two) times daily.   Reviewed prior external information including notes and imaging from  primary care provider As well as notes that were available from care everywhere and other healthcare systems.  Past medical history, social, surgical  and family history all reviewed in electronic medical record.  No pertanent information unless stated regarding to the chief complaint.   Review of Systems:  No headache, visual changes, nausea, vomiting, diarrhea, constipation, dizziness, abdominal pain, skin rash, fevers, chills, night sweats, weight loss, swollen lymph nodes, , joint swelling, chest pain, shortness of breath, mood changes. POSITIVE muscle aches, body aches  Objective  Blood pressure 110/60, pulse 79, height 5' 9.5" (1.765 m), weight 178 lb (80.7 kg), SpO2 97 %.   General: No apparent distress alert and oriented x3 mood and affect normal, dressed appropriately.  HEENT: Pupils equal, extraocular movements intact  Respiratory: Patient's  speak in full sentences and does not appear short of breath  Cardiovascular: No lower extremity edema, non tender, no erythema  Neuro: Cranial nerves II through XII are intact, neurovascularly intact in all extremities with 2+ DTRs and 2+ pulses.  Gait n mild antalgic favoring left leg.  Negative straight leg test.  No pain though with resisted extension of the knee quad.  Tender to the quad to palpation just on the medial aspect.  Worsening pain with flexion of the knee in the quadricep.  Back mild discomfort but diffuse.  Arthritic changes noted.  Limited musculoskeletal ultrasound was performed and interpreted by [name]  Limited ultrasound of patient's left quad shows potentially a strain noted of the superficial musculature medially but no significant tear noted or gapping.  Otherwise fairly unremarkable   Impression and Recommendations:     This case required medical decision making of moderate complexity. The above documentation has been reviewed and is accurate and complete Caitlyn Pulley, DO       Note: This dictation was prepared with Dragon dictation along with smaller phrase technology. Any transcriptional errors that result from this process are unintentional.

## 2019-05-26 NOTE — Assessment & Plan Note (Signed)
New problem, likely quadricep strain, patient has had difficulty previously for this.  Patient has had an L3 nerve impingement as well that was mostly on the left side and this could be an aggravation of this.  If continuing to have trouble we will consider EMG.  Patient will though do now a thigh compression sleeve and home exercises.  X-rays pending of the back to further evaluate with a questionable hip as well.  Patient has relatively good range of motion of the hip so hopefully patient will respond well.  Follow-up again 4 weeks.  Discussed medication such as Lyrica and Relafen

## 2019-05-26 NOTE — Patient Instructions (Signed)
Good to see you.  Ice 20 minutes 2 times daily. Usually after activity and before bed. Exercises 3 times a week.  Compression sleeve See me again in 4-5 weeks

## 2019-05-28 DIAGNOSIS — H5203 Hypermetropia, bilateral: Secondary | ICD-10-CM | POA: Diagnosis not present

## 2019-05-28 DIAGNOSIS — H2513 Age-related nuclear cataract, bilateral: Secondary | ICD-10-CM | POA: Diagnosis not present

## 2019-06-25 ENCOUNTER — Ambulatory Visit: Payer: Medicare HMO | Admitting: Family Medicine

## 2019-06-25 ENCOUNTER — Encounter: Payer: Self-pay | Admitting: Family Medicine

## 2019-06-25 ENCOUNTER — Other Ambulatory Visit: Payer: Self-pay

## 2019-06-25 VITALS — BP 120/80 | HR 72 | Ht 69.0 in | Wt 175.0 lb

## 2019-06-25 DIAGNOSIS — S76112A Strain of left quadriceps muscle, fascia and tendon, initial encounter: Secondary | ICD-10-CM

## 2019-06-25 NOTE — Assessment & Plan Note (Addendum)
Patient is making some progress.  He is having some cramping in the leg.  Does have some lumbar radiculopathy but I do think is contributing as well.  Discussed which activities to do which wants to avoid.  Increase activity slowly.  Patient will start with formal physical therapy to try to help with the range of motion and strengthening focusing on eccentric's.  Discussed the medications again at great length including the Relafen.  Follow-up again 4 to 8 weeks.  Discussion with patients with athletic trainer, myself, reviewing patient's imaging total today 33 minutes

## 2019-06-25 NOTE — Patient Instructions (Signed)
[facility] Continue to wear compression w paddling See me one time in 6 weeks

## 2019-06-25 NOTE — Progress Notes (Signed)
[name] [address] [place] [address] Phone: [phone] Subjective:   [name], am serving as a scribe for Dr. Antoine Primas. This visit occurred during the SARS-CoV-2 public health emergency.  Safety protocols were in place, including screening questions prior to the visit, additional usage of staff PPE, and extensive cleaning of exam room while observing appropriate contact time as indicated for disinfecting solutions.   I'm seeing this patient by the request  of:  Bobbie Stack, MD  CC: Leg pain follow-up  GUR:[id number]   05/26/2019 New problem, likely quadricep strain, patient has had difficulty previously for this.  Patient has had an L3 nerve impingement as well that was mostly on the left side and this could be an aggravation of this.  If continuing to have trouble we will consider EMG.  Patient will though do now a thigh compression sleeve and home exercises.  X-rays pending of the back to further evaluate with a questionable hip as well.  Patient has relatively good range of motion of the hip so hopefully patient will respond well.  Follow-up again 4 weeks.  Discussed medication such as Lyrica and Relafen  Update 06/25/2019 Tina Gruner is a 74 y.o. female coming in with complaint of left, quad strain. Patient states that she was doing well until she had to help deliver some puppies on Sunday. Discontinued quad sleeve.      No past medical history on file. No past surgical history on file. Social History   Socioeconomic History  . Marital status: Widowed    Spouse name: Not on file  . Number of children: Not on file  . Years of education: Not on file  . Highest education level: Not on file  Occupational History  . Not on file  Tobacco Use  . Smoking status: Not on file  Substance and Sexual Activity  . Alcohol use: Not on file  . Drug use: Not on file  . Sexual activity: Not on file  Other Topics Concern  . Not  on file  Social History Narrative  . Not on file   Social Determinants of Health   Financial Resource Strain:   . Difficulty of Paying Living Expenses:   Food Insecurity:   . Worried About Programme researcher, broadcasting/film/video in the Last Year:   . Barista in the Last Year:   Transportation Needs:   . Freight forwarder (Medical):   Marland Kitchen Lack of Transportation (Non-Medical):   Physical Activity:   . Days of Exercise per Week:   . Minutes of Exercise per Session:   Stress:   . Feeling of Stress :   Social Connections:   . Frequency of Communication with Friends and Family:   . Frequency of Social Gatherings with Friends and Family:   . Attends Religious Services:   . Active Member of Clubs or Organizations:   . Attends Banker Meetings:   Marland Kitchen Marital Status:    Not on File no known drug allergies     Current Outpatient Medications (Analgesics):  .  ibuprofen (ADVIL) 200 MG tablet, Take 200 mg by mouth in the morning and at bedtime. .  nabumetone (RELAFEN) 500 MG tablet, Take 500 mg by mouth daily.   Current Outpatient Medications (Other):  .  bisacodyl (DULCOLAX) 5 MG EC tablet, Take 5 mg by mouth daily as needed for moderate constipation. .  cholecalciferol (VITAMIN D3) 25 MCG (1000 UNIT) tablet, Take  1,000 Units by mouth daily. .  magnesium gluconate (MAGONATE) 500 MG tablet, Take 500 mg by mouth daily. .  Melatonin 10 MG TABS, Take by mouth. .  pantoprazole (PROTONIX) 40 MG tablet, Take 40 mg by mouth daily. .  pregabalin (LYRICA) 150 MG capsule, Take 150 mg by mouth 2 (two) times daily.   Reviewed prior external information including notes and imaging from  primary care provider As well as notes that were available from care everywhere and other healthcare systems.  Past medical history, social, surgical and family history all reviewed in electronic medical record.  No pertanent information unless stated regarding to the chief complaint.   Review of Systems:   No headache, visual changes, nausea, vomiting, diarrhea, constipation, dizziness, abdominal pain, skin rash, fevers, chills, night sweats, weight loss, swollen lymph nodes, body aches, joint swelling, chest pain, shortness of breath, mood changes. POSITIVE muscle aches  Objective  Blood pressure 120/80, pulse 72, height 5\' 9"  (1.753 m), weight 175 lb (79.4 kg), SpO2 98 %.   General: No apparent distress alert and oriented x3 mood and affect normal, dressed appropriately.  HEENT: Pupils equal, extraocular movements intact  Respiratory: Patient's speak in full sentences and does not appear short of breath  Cardiovascular: No lower extremity edema, non tender, no erythema  Neuro: Cranial nerves II through XII are intact, neurovascularly intact in all extremities with 2+ DTRs and 2+ pulses.  Gait normal with good balance and coordination.  MSK: Mild arthritic changes of multiple joints. Patient's left quad still has some tenderness mostly at the musculotendinous juncture proximally.  Patient also has a negative straight leg test which is somewhat of an improvement today.  Back exam does still have some discomfort more in the thoracolumbar in the lumbosacral area.    Impression and Recommendations:      The above documentation has been reviewed and is accurate and complete Lyndal Pulley, DO       Note: This dictation was prepared with Dragon dictation along with smaller phrase technology. Any transcriptional errors that result from this process are unintentional.

## 2019-06-27 ENCOUNTER — Ambulatory Visit: Payer: Medicare HMO | Attending: Family Medicine | Admitting: Physical Therapy

## 2019-06-27 ENCOUNTER — Other Ambulatory Visit: Payer: Self-pay

## 2019-06-27 ENCOUNTER — Encounter: Payer: Self-pay | Admitting: Physical Therapy

## 2019-06-27 DIAGNOSIS — M79652 Pain in left thigh: Secondary | ICD-10-CM | POA: Diagnosis not present

## 2019-06-27 DIAGNOSIS — M6281 Muscle weakness (generalized): Secondary | ICD-10-CM

## 2019-06-27 NOTE — Therapy (Signed)
[facility] [address] [place], [place], [address] Phone: [phone]   Fax:  [phone]  Physical Therapy Evaluation  Patient Details  Name: [name] MRN: [id number] Date of Birth: 11-Apr-1945 Referring Provider (PT): Dr. Judi Saa   Encounter Date: 06/27/2019  PT End of Session - 06/27/19 1110    Visit Number  1    Date for PT Re-Evaluation  08/22/19    Authorization Type  [employer]    PT Start Time  1015    PT Stop Time  1050    PT Time Calculation (min)  35 min    Activity Tolerance  Patient tolerated treatment well;No increased pain    Behavior During Therapy  [facility] for tasks assessed/performed       History reviewed. No pertinent past medical history.  History reviewed. No pertinent surgical history.  There were no vitals filed for this visit.   Subjective Assessment - 06/27/19 1022    Subjective  Patient was taking an exercise class. She was to do Hamstring curl on the left and not able to do it.    Patient Stated Goals  reduce pain    Currently in Pain?  Yes    Pain Score  9     Pain Location  Leg   left quadricep   Pain Orientation  Left    Pain Descriptors / Indicators  Stabbing    Pain Type  Acute pain    Pain Onset  More than a month ago    Pain Frequency  Intermittent    Aggravating Factors   lifting a leg, flexing the left leg in standing    Pain Relieving Factors  keeping the knee straight    Multiple Pain Sites  No         [facility] PT Assessment - 06/27/19 0001      Assessment   Medical Diagnosis  S76.112A Quadricep strain, left, initial encounter    Referring Provider (PT)  Dr. Judi Saa    Prior Therapy  none      Precautions   Precautions  None      Restrictions   Weight Bearing Restrictions  No      Balance Screen   Has the patient fallen in the past 6 months  No    Has the patient had a decrease in activity level because of a fear of falling?    No    Is the patient reluctant to leave their home because of a fear of falling?   No      Home Public house manager residence      Prior Function   Level of Independence  Independent    Vocation  Retired    IT trainer, exercise class      Cognition   Overall Cognitive Status  Within Functional Limits for tasks assessed      Observation/Other Assessments   Focus on Therapeutic Outcomes (FOTO)   37% limitation; goal is 23% limitation      Posture/Postural Control   Posture/Postural Control  No significant limitations      ROM / Strength   AROM / PROM / Strength  AROM;PROM;Strength      AROM   Left Knee Flexion  80   prone     Strength   Left Hip Flexion  4/5    Left Hip Extension  4/5    Left Knee Flexion  4/5  Left Knee Extension  5/5      Palpation   SI assessment   left ilium rotated posteriorly    Palpation comment  tightness in the upper lateral right thigh                  Objective measurements completed on examination: See above findings.      [facility] Adult PT Treatment/Exercise - 06/27/19 0001      Knee/Hip Exercises: Stretches   Quad Stretch  Left;2 reps;30 seconds    Quad Stretch Limitations  supine with leg off mat and using a sheet    Hip Flexor Stretch  Left;1 rep;30 seconds    Hip Flexor Stretch Limitations  standing in lunge position      Manual Therapy   Manual Therapy  Soft tissue mobilization;Muscle Energy Technique    Manual therapy comments  manually stretched the left quadricep    Soft tissue mobilization  using the addaday to left quadricep    Muscle Energy Technique  to correct left ilium             PT Education - 06/27/19 1110    Education Details  Access Code: [email or web address]    Person(s) Educated  Patient    Methods  Explanation;Demonstration;Verbal cues;Handout    Comprehension  Returned demonstration;Verbalized understanding       PT Short Term Goals - 06/27/19 1116      PT SHORT  TERM GOAL #1   Title  independent with initial HEP    Time  4    Status  New    Target Date  07/25/19        PT Long Term Goals - 06/27/19 1117      PT LONG TERM GOAL #1   Title  independent with advanced HEP    Time  8    Period  Weeks    Status  New    Target Date  08/22/19      PT LONG TERM GOAL #2   Title  prone knee flexion A/ROM >/= 110 degrees due to elongation of the left quadricep    Time  8    Period  Weeks    Status  New    Target Date  08/22/19      PT LONG TERM GOAL #3   Title  able to walk up and down an incline without difficulty or pain due to decreased trigger points in the left quadriceps    Time  8    Period  Weeks    Status  New    Target Date  08/22/19      PT LONG TERM GOAL #4   Title  able to go up and down steps without difficulty due to left hip and knee strength 5/5    Time  8    Period  Weeks    Status  New    Target Date  08/22/19      PT LONG TERM GOAL #5   Title  FOTO score </- 23% limitation    Time  8    Period  Weeks    Status  New    Target Date  08/22/19             Plan - 06/27/19 1111    Clinical Impression Statement  Patient is a 74 year old female who injured her left quadricep in exercise class. Patient was not able to flex her hip or knee and would drag her leg.  Patient reports intermittent pain level at 8/10. Left hip flexion, hip extension and knee flexion 4/5. Left ilium is posteriorly rotated. Tightness in the left quadriceps. Prone knee flexion on the left is 80 degrees. Patient has difficulty with going up and down an incline, walking, going up stairs. Patient will benefit from skilled therapy to reduce left quadricep pain and improve left quad mobiity.    Examination-Activity Limitations  Locomotion Level;Stairs    Stability/Clinical Decision Making  Stable/Uncomplicated    Clinical Decision Making  Low    Rehab Potential  Excellent    PT Frequency  2x / week    PT Duration  8 weeks    PT  Treatment/Interventions  Cryotherapy;Electrical Stimulation;Iontophoresis 4mg /ml Dexamethasone;Moist Heat;Ultrasound;Neuromuscular re-education;Therapeutic exercise;Therapeutic activities;Gait training;Patient/family education;Manual techniques;Dry needling;Passive range of motion;Taping    PT Next Visit Plan  see if pelvis is corrected, nustep, dry needling to the left quadricep, work on stretching the left quadricep, eccentric strength of the left quadricep    PT Home Exercise Plan  Access Code: [email or web address]    Consulted and Agree with Plan of Care  Patient       Patient will benefit from skilled therapeutic intervention in order to improve the following deficits and impairments:  Decreased range of motion, Difficulty walking, Increased fascial restricitons, Pain, Decreased strength, Decreased mobility  Visit Diagnosis: Muscle weakness (generalized) - Plan: PT plan of care cert/re-cert  Pain in left thigh - Plan: PT plan of care cert/re-cert     Problem List Patient Active Problem List   Diagnosis Date Noted  . Quadriceps strain, left, initial encounter 05/26/2019    Earlie Counts, PT 06/27/19 11:21 AM   [facility] [address] [place], [place], [address] Phone: [phone]   Fax:  [phone]  Name: [name] MRN: [id number] Date of Birth: 24-Jan-1946

## 2019-06-27 NOTE — Patient Instructions (Signed)
Access Code: [email or web address] URL: [email or web address] Date: 06/27/2019 Prepared by: Eulis Foster  Exercises Supine Quadriceps Stretch with Strap on Table - 2 x daily - 7 x weekly - 1 sets - 2 reps Standing Hip Flexor Stretch - 2 x daily - 7 x weekly - 1 sets - 2 reps [facility] [address] [place], [place] [address] Phone # [phone] Fax [phone]

## 2019-07-03 ENCOUNTER — Ambulatory Visit: Payer: Medicare HMO | Admitting: Physical Therapy

## 2019-07-03 ENCOUNTER — Other Ambulatory Visit: Payer: Self-pay

## 2019-07-03 ENCOUNTER — Encounter: Payer: Self-pay | Admitting: Physical Therapy

## 2019-07-03 DIAGNOSIS — M6281 Muscle weakness (generalized): Secondary | ICD-10-CM

## 2019-07-03 DIAGNOSIS — M79652 Pain in left thigh: Secondary | ICD-10-CM

## 2019-07-03 NOTE — Patient Instructions (Signed)
Access Code: [email or web address] URL: [email or web address] Date: 06/27/2019 Prepared by: Eulis Foster  Exercises Supine Quadriceps Stretch with Strap on Table - 2 x daily - 7 x weekly - 1 sets - 2 reps Standing Hip Flexor Stretch - 2 x daily - 7 x weekly - 1 sets - 2 reps Active Straight Leg Raise with Quad Set - 1 x daily - 7 x weekly - 2 sets - 15 reps Supine Knee to Chest with Leg Straight - 1 x daily - 7 x weekly - 1 sets - 15 reps Seated Knee Extension with Resistance - 1 x daily - 7 x weekly - 2 sets - 10 reps Sideways Step Touch - 1 x daily - 7 x weekly - 2 sets - 10 reps [facility] [address], [address] [place], [place] [address] Phone # [phone] Fax [phone]

## 2019-07-03 NOTE — Therapy (Signed)
[facility] [address], [place], [place], [address] Phone: [phone]   Fax:  [phone]  Physical Therapy Treatment  Patient Details  Name: [name] MRN: [id number] Date of Birth: 1945-05-15 Referring Provider (PT): Dr. Lyndal Pulley   Encounter Date: 07/03/2019  PT End of Session - 07/03/19 1141    Visit Number  2    Date for PT Re-Evaluation  08/22/19    Authorization Type  [employer] Medicare    PT Start Time  1100    PT Stop Time  1140    PT Time Calculation (min)  40 min    Activity Tolerance  Patient tolerated treatment well;No increased pain    Behavior During Therapy  [facility] for tasks assessed/performed       History reviewed. No pertinent past medical history.  History reviewed. No pertinent surgical history.  There were no vitals filed for this visit.  Subjective Assessment - 07/03/19 1101    Subjective  I felt good after last visit. I can stretch the quadricep further now.    Patient Stated Goals  reduce pain    Currently in Pain?  Yes    Pain Score  9     Pain Location  Leg   left quadricep   Pain Orientation  Left    Pain Descriptors / Indicators  Stabbing    Pain Type  Acute pain    Pain Onset  More than a month ago    Pain Frequency  Intermittent    Aggravating Factors   lifting a leg, flexing the left leg in standing    Pain Relieving Factors  keeping the knee straigth    Multiple Pain Sites  No                        [facility] Adult PT Treatment/Exercise - 07/03/19 0001      Lumbar Exercises: Aerobic   Nustep  level 1 5 min. while assessing her goals      Knee/Hip Exercises: Standing   Lateral Step Up  Left;1 set;15 reps    Lateral Step Up Limitations  coming down slowly      Knee/Hip Exercises: Seated   Long Arc Quad  Strengthening;Left;2 sets;10 reps    Long Arc Quad Limitations  red band, flex knee slowly for eccentric contraction      Knee/Hip  Exercises: Supine   Straight Leg Raises  Left;Strengthening;1 set;15 reps    Other Supine Knee/Hip Exercises  supine left hip flexion 15 times      Manual Therapy   Manual Therapy  Soft tissue mobilization    Soft tissue mobilization  to left quadriceps with contract relax to lengthen the left quadriceps       Trigger Point Dry Needling - 07/03/19 0001    Consent Given?  Yes    Education Handout Provided  Yes    Muscles Treated Lower Quadrant  Quadriceps    Quadriceps Response  Twitch response elicited;Palpable increased muscle length           PT Education - 07/03/19 1140    Education Details  Access Code: [email or web address]    Person(s) Educated  Patient    Methods  Explanation;Demonstration;Verbal cues;Handout    Comprehension  Verbalized understanding;Returned demonstration       PT Short Term Goals - 07/03/19 1144      PT SHORT TERM GOAL #1   Title  independent with initial HEP  Time  4    Status  On-going        PT Long Term Goals - 06/27/19 1117      PT LONG TERM GOAL #1   Title  independent with advanced HEP    Time  8    Period  Weeks    Status  New    Target Date  08/22/19      PT LONG TERM GOAL #2   Title  prone knee flexion A/ROM >/= 110 degrees due to elongation of the left quadricep    Time  8    Period  Weeks    Status  New    Target Date  08/22/19      PT LONG TERM GOAL #3   Title  able to walk up and down an incline without difficulty or pain due to decreased trigger points in the left quadriceps    Time  8    Period  Weeks    Status  New    Target Date  08/22/19      PT LONG TERM GOAL #4   Title  able to go up and down steps without difficulty due to left hip and knee strength 5/5    Time  8    Period  Weeks    Status  New    Target Date  08/22/19      PT LONG TERM GOAL #5   Title  FOTO score </- 23% limitation    Time  8    Period  Weeks    Status  New    Target Date  08/22/19            Plan - 07/03/19 1125    Clinical  Impression Statement  Patient is able to flex her left knee to 90 degrees with hip extended in supine. Patient has trigger points in the left quadriceps and tightness. She is able to do her exercises without increase in pain just tight feeling. Patient will benefit from skilled therapy to reduce left quadricep pain and improve left quad mobility.    Examination-Activity Limitations  Locomotion Level;Stairs    Stability/Clinical Decision Making  Stable/Uncomplicated    Rehab Potential  Excellent    PT Frequency  2x / week    PT Duration  8 weeks    PT Treatment/Interventions  Cryotherapy;Electrical Stimulation;Iontophoresis 4mg /ml Dexamethasone;Moist Heat;Ultrasound;Neuromuscular re-education;Therapeutic exercise;Therapeutic activities;Gait training;Patient/family education;Manual techniques;Dry needling;Passive range of motion;Taping    PT Next Visit Plan  nustep, dry needling to the left quadricep, work on stretching the left quadricep, eccentric strength of the left quadricep, squats, lunges    PT Home Exercise Plan  Access Code:    Recommended Other Services  MD signed intial note    Consulted and Agree with Plan of Care  Patient       Patient will benefit from skilled therapeutic intervention in order to improve the following deficits and impairments:  Decreased range of motion, Difficulty walking, Increased fascial restricitons, Pain, Decreased strength, Decreased mobility  Visit Diagnosis: Muscle weakness (generalized)  Pain in left thigh     Problem List Patient Active Problem List   Diagnosis Date Noted  . Quadriceps strain, left, initial encounter 05/26/2019    05/28/2019, PT 07/03/19 11:45 AM   [facility] [address], [address] [place], [place], [place] Phone: [phone]   Fax:  [phone]  Name: [name] MRN: [name] Date of Birth: Aug 16, 1945

## 2019-07-09 ENCOUNTER — Ambulatory Visit: Payer: Medicare HMO | Attending: Family Medicine | Admitting: Physical Therapy

## 2019-07-09 ENCOUNTER — Encounter: Payer: Self-pay | Admitting: Physical Therapy

## 2019-07-09 ENCOUNTER — Other Ambulatory Visit: Payer: Self-pay

## 2019-07-09 DIAGNOSIS — M79652 Pain in left thigh: Secondary | ICD-10-CM | POA: Diagnosis not present

## 2019-07-09 DIAGNOSIS — M6281 Muscle weakness (generalized): Secondary | ICD-10-CM | POA: Insufficient documentation

## 2019-07-09 NOTE — Therapy (Signed)
[facility] [address] [place], [place], [address] Phone: [phone]   Fax:  [phone]  Physical Therapy Treatment  Patient Details  Name: [name] MRN: [id number] Date of Birth: 18-Jun-1945 Referring Provider (PT): Dr. Lyndal Pulley   Encounter Date: 07/09/2019  PT End of Session - 07/09/19 1106    Visit Number  3    Date for PT Re-Evaluation  08/22/19    Authorization Type  [employer] Medicare    PT Start Time  1106    PT Stop Time  1148    PT Time Calculation (min)  42 min    Activity Tolerance  Patient tolerated treatment well;No increased pain    Behavior During Therapy  [facility] for tasks assessed/performed       History reviewed. No pertinent past medical history.  History reviewed. No pertinent surgical history.  There were no vitals filed for this visit.  Subjective Assessment - 07/09/19 1111    Subjective  I have not been able to maintain any flexibility in the Lt thigh.    Patient Stated Goals  reduce pain    Currently in Pain?  Yes    Pain Score  8     Pain Location  Leg    Pain Orientation  Left;Anterior;Upper    Pain Descriptors / Indicators  Sore;Stabbing    Pain Type  Acute pain    Pain Onset  More than a month ago    Pain Frequency  Intermittent    Aggravating Factors   flexing leg in standing    Pain Relieving Factors  keeping knee straight                        [facility] Adult PT Treatment/Exercise - 07/09/19 0001      Knee/Hip Exercises: Stretches   Sports administrator  Left;60 seconds    Quad Stretch Limitations  standing with strap x 2x30", knee at 90 deg facing counter with leg on stool, contract relax 5x5, stand tall and do pelvic tilt for increased stretch    Hip Flexor Stretch  Left    Hip Flexor Stretch Limitations  foot on 2nd step and standing in stagger stance       Trigger Point Dry Needling - 07/09/19 0001    Consent Given?  Yes    Education Handout  Provided  Previously provided    Muscles Treated Lower Quadrant  Quadriceps    Other Dry Needling  Left    Quadriceps Response  Twitch response elicited;Palpable increased muscle length             PT Short Term Goals - 07/03/19 1144      PT SHORT TERM GOAL #1   Title  independent with initial HEP    Time  4    Status  On-going        PT Long Term Goals - 06/27/19 1117      PT LONG TERM GOAL #1   Title  independent with advanced HEP    Time  8    Period  Weeks    Status  New    Target Date  08/22/19      PT LONG TERM GOAL #2   Title  prone knee flexion A/ROM >/= 110 degrees due to elongation of the left quadricep    Time  8    Period  Weeks    Status  New    Target  Date  08/22/19      PT LONG TERM GOAL #3   Title  able to walk up and down an incline without difficulty or pain due to decreased trigger points in the left quadriceps    Time  8    Period  Weeks    Status  New    Target Date  08/22/19      PT LONG TERM GOAL #4   Title  able to go up and down steps without difficulty due to left hip and knee strength 5/5    Time  8    Period  Weeks    Status  New    Target Date  08/22/19      PT LONG TERM GOAL #5   Title  FOTO score </- 23% limitation    Time  8    Period  Weeks    Status  New    Target Date  08/22/19            Plan - 07/09/19 1148    Clinical Impression Statement  Pt with continued tightness and tenderness in quadriceps (rectus femoris).  She is currently unable to extend hip and flex knee due to pain/flexibility restriction.  She is able to reach 90 deg at knee flexion with neutral hip in standing today.  Contract/relax with stretching with both hip flexor focus and quad focus, DN to quad and STM of quad were used today.  Pt felt she made progress with looser thigh end of session but was sore.  PT reiterated DN aftercare for soreness.  Pt will continue to benefit from skilled PT to address pain and restriction.    Rehab Potential   Excellent    PT Frequency  2x / week    PT Duration  8 weeks    PT Treatment/Interventions  Cryotherapy;Electrical Stimulation;Iontophoresis 4mg /ml Dexamethasone;Moist Heat;Ultrasound;Neuromuscular re-education;Therapeutic exercise;Therapeutic activities;Gait training;Patient/family education;Manual techniques;Dry needling;Passive range of motion;Taping    PT Next Visit Plan  NuStep, DN Lt quad, elongation across anterior hip and thigh, eccentric strength of Lt quad, squat/lunge    PT Home Exercise Plan  Access Code:    Consulted and Agree with Plan of Care  Patient       Patient will benefit from skilled therapeutic intervention in order to improve the following deficits and impairments:     Visit Diagnosis: Muscle weakness (generalized)  Pain in left thigh     Problem List Patient Active Problem List   Diagnosis Date Noted  . Quadriceps strain, left, initial encounter 05/26/2019   05/28/2019, PT 07/09/19 11:53 AM   [facility] [address], [address] [place], [place], [place] Phone: [phone]   Fax:  [phone]  Name: [name] MRN: [name] Date of Birth: [birth year]

## 2019-07-25 ENCOUNTER — Other Ambulatory Visit: Payer: Self-pay

## 2019-07-25 ENCOUNTER — Encounter: Payer: Self-pay | Admitting: Physical Therapy

## 2019-07-25 ENCOUNTER — Ambulatory Visit: Payer: Medicare HMO | Admitting: Physical Therapy

## 2019-07-25 DIAGNOSIS — M6281 Muscle weakness (generalized): Secondary | ICD-10-CM | POA: Diagnosis not present

## 2019-07-25 DIAGNOSIS — M79652 Pain in left thigh: Secondary | ICD-10-CM

## 2019-07-25 NOTE — Therapy (Signed)
[facility] [address] [place], [place], [address] Phone: [phone]   Fax:  [phone]  Physical Therapy Treatment  Patient Details  Name: [name] MRN: [id number] Date of Birth: 1945-09-22 Referring Provider (PT): Dr. Judi Saa   Encounter Date: 07/25/2019   PT End of Session - 07/25/19 0925    Visit Number 4    Date for PT Re-Evaluation 08/22/19    Authorization Type A[employer]na Medicare    PT Start Time [phone]    PT Stop Time 1010    PT Time Calculation (min) 45 min    Activity Tolerance Patient tolerated treatment well;No increased pain    Behavior During Therapy [facility] for tasks assessed/performed           History reviewed. No pertinent past medical history.  History reviewed. No pertinent surgical history.  There were no vitals filed for this visit.   Subjective Assessment - 07/25/19 0926    Subjective I had a lot of pain in the thigh after last visit's DN and stretching but it did give me more movement and the soreness reduced over time. When I try to stretch it on my own it exacerbates my L3 nerve pain.  I have lost the 90 degrees we got last time.    Patient Stated Goals reduce pain    Currently in Pain? Yes    Pain Score 7    with certain movements/use of Lt LE   Pain Location Leg    Pain Orientation Left;Anterior;Proximal    Pain Descriptors / Indicators Tightness;Stabbing    Pain Type Acute pain    Pain Onset More than a month ago    Pain Frequency Intermittent    Aggravating Factors  flexion leg in standing (hip ext with knee flexion)                             [facility] Adult PT Treatment/Exercise - 07/25/19 0001      Knee/Hip Exercises: Stretches   Lobbyist Left;60 seconds    Quad Stretch Limitations standing with strap x 2x30", knee at 90 deg facing counter with leg on stool, contract relax 5x5, stand tall and do pelvic tilt for increased stretch    Hip  Flexor Stretch Left    Hip Flexor Stretch Limitations foot on 2nd step and standing in stagger stance    Other Knee/Hip Stretches Lt leg off side of table neutral hip and knee flexion (reached 95 deg knee flexion after DN and STM)      Knee/Hip Exercises: Standing   Functional Squat 10 reps    Functional Squat Limitations squat to chair, eccentric control focus    SLS with Vectors Lt single leg squat with forward heel tap and side toe taps Rt LE x 10 reps each, single UE support    Other Standing Knee Exercises backward lunges 1x5 Rt/Lt, single UE support      Manual Therapy   Soft tissue mobilization spikey roller massage proximal quads on Lt, stripping Lt proximal quad with active release Lt leg off table active knee flexion            Trigger Point Dry Needling - 07/25/19 0001    Consent Given? Yes    Education Handout Provided Previously provided    Muscles Treated Lower Quadrant Vastus intermedius;Quadriceps    Other Dry Needling Left, proximal    Quadriceps Response Twitch response elicited;Palpable  increased muscle length    Vastus intermedius Response Twitch response elicited;Palpable increased muscle length                  PT Short Term Goals - 07/03/19 1144      PT SHORT TERM GOAL #1   Title independent with initial HEP    Time 4    Status On-going             PT Long Term Goals - 06/27/19 1117      PT LONG TERM GOAL #1   Title independent with advanced HEP    Time 8    Period Weeks    Status New    Target Date 08/22/19      PT LONG TERM GOAL #2   Title prone knee flexion A/ROM >/= 110 degrees due to elongation of the left quadricep    Time 8    Period Weeks    Status New    Target Date 08/22/19      PT LONG TERM GOAL #3   Title able to walk up and down an incline without difficulty or pain due to decreased trigger points in the left quadriceps    Time 8    Period Weeks    Status New    Target Date 08/22/19      PT LONG TERM GOAL #4    Title able to go up and down steps without difficulty due to left hip and knee strength 5/5    Time 8    Period Weeks    Status New    Target Date 08/22/19      PT LONG TERM GOAL #5   Title FOTO score </- 23% limitation    Time 8    Period Weeks    Status New    Target Date 08/22/19                 Plan - 07/25/19 1012    Clinical Impression Statement Pt was unable to maintain Lt quad flexibility since last visit.  She has no pain unless she attempts to stretch the thigh or moves a way that causes it to stretch.  She was able to achieve 85 deg knee flexion with thigh in neutral in supine/leg off table on arrival, with pain.  She achieved 95 deg knee flexion in same position with less pain end of session following DN and STM techniques.  She is quick to fatigue in Lt SLS closed chain tasks.  She is discouraged by this b/c she is otherwise so active/strong.  PT recommended increased compliance with Lt LE stretching off edge of couch/bed (thomas test position), use of massage roller, and standing strap stretching between visits to see if she can maintain range.  Continue along POC for DN, STM, and closed chain Lt thigh strength.    PT Frequency 2x / week    PT Duration 8 weeks    PT Treatment/Interventions Cryotherapy;Electrical Stimulation;Iontophoresis 4mg /ml Dexamethasone;Moist Heat;Ultrasound;Neuromuscular re-education;Therapeutic exercise;Therapeutic activities;Gait training;Patient/family education;Manual techniques;Dry needling;Passive range of motion;Taping    PT Next Visit Plan DN Lt proximal quad as needed, STM using spikey roller/stripping/active release, Lt thigh stretching off table and standing with strap, eccentric strength of Lt quad    PT Home Exercise Plan Access Code: [email or web address]    Consulted and Agree with Plan of Care Patient           Patient will benefit from skilled therapeutic intervention in order to improve the following deficits  and impairments:  Decreased  range of motion, Difficulty walking, Increased fascial restricitons, Pain, Decreased strength, Decreased mobility  Visit Diagnosis: Muscle weakness (generalized)  Pain in left thigh     Problem List Patient Active Problem List   Diagnosis Date Noted  . Quadriceps strain, left, initial encounter 05/26/2019    Morton Peters, PT 07/25/19 10:16 AM   [facility] [address], [address] [place], [place], [address] Phone: [phone]   Fax:  [phone]  Name: Noele Icenhour MRN: [id number] Date of Birth: 02-28-1945

## 2019-07-30 ENCOUNTER — Ambulatory Visit: Payer: Medicare HMO | Admitting: Physical Therapy

## 2019-07-30 ENCOUNTER — Encounter: Payer: Self-pay | Admitting: Physical Therapy

## 2019-07-30 ENCOUNTER — Other Ambulatory Visit: Payer: Self-pay

## 2019-07-30 DIAGNOSIS — M6281 Muscle weakness (generalized): Secondary | ICD-10-CM | POA: Diagnosis not present

## 2019-07-30 DIAGNOSIS — M79652 Pain in left thigh: Secondary | ICD-10-CM

## 2019-07-30 NOTE — Therapy (Signed)
[facility] [address], [address] [place], [place], [address] Phone: [phone]   Fax:  [phone]  Physical Therapy Treatment  Patient Details  Name: An Lannan MRN: [id number] Date of Birth: 04-15-1945 Referring Provider (PT): Dr. Judi Saa   Encounter Date: 07/30/2019   PT End of Session - 07/30/19 0931    Visit Number 5    Date for PT Re-Evaluation 08/22/19    Authorization Type [employer]na Medicare    PT Start Time [phone]    PT Stop Time 1016    PT Time Calculation (min) 45 min    Activity Tolerance Patient tolerated treatment well;No increased pain    Behavior During Therapy [facility] for tasks assessed/performed           History reviewed. No pertinent past medical history.  History reviewed. No pertinent surgical history.  There were no vitals filed for this visit.   Subjective Assessment - 07/30/19 0935    Subjective Only time I really have pain is on decline walk down driveway.    Patient Stated Goals reduce pain    Currently in Pain? No/denies                             [facility] Adult PT Treatment/Exercise - 07/30/19 0001      Lumbar Exercises: Aerobic   Recumbent Bike L2 x 5 min      Knee/Hip Exercises: Stretches   Lobbyist Left;60 seconds    Lobbyist Limitations worked on several different stretches in standing and prone with strap    Hip Flexor Stretch Left    Hip Flexor Stretch Limitations foot on 2nd step and standing in stagger stance; also warrior position x 30 sec left    Other Knee/Hip Stretches left leg off EOB x 60 sec then with contract relax       Manual Therapy   Manual Therapy Soft tissue mobilization;Joint mobilization    Manual therapy comments skilled palpation and monitoring of soft tissues during DN    Joint Mobilization A/P left hip joint mobs Gd I-III; pain with III in quad    Soft tissue mobilization deep to proximal quads, TFL and HF    Muscle  Energy Technique assessed pelvis and was level            Trigger Point Dry Needling - 07/30/19 0001    Consent Given? Yes    Education Handout Provided Previously provided    Muscles Treated Lower Quadrant Quadriceps;Rectus femoris;Vastus lateralis;Vastus intermedius    Muscles Treated Back/Hip Tensor fascia lata    Electrical Stimulation Performed with Dry Needling Yes    E-stim with Dry Needling Details 10 freq x 5 min    Other Dry Needling left proximal    Quadriceps Response Twitch response elicited;Palpable increased muscle length    Tensor Fascia Lata Response Palpable increased muscle length                  PT Short Term Goals - 07/03/19 1144      PT SHORT TERM GOAL #1   Title independent with initial HEP    Time 4    Status On-going             PT Long Term Goals - 06/27/19 1117      PT LONG TERM GOAL #1   Title independent with advanced HEP    Time 8    Period  Weeks    Status New    Target Date 08/22/19      PT LONG TERM GOAL #2   Title prone knee flexion A/ROM >/= 110 degrees due to elongation of the left quadricep    Time 8    Period Weeks    Status New    Target Date 08/22/19      PT LONG TERM GOAL #3   Title able to walk up and down an incline without difficulty or pain due to decreased trigger points in the left quadriceps    Time 8    Period Weeks    Status New    Target Date 08/22/19      PT LONG TERM GOAL #4   Title able to go up and down steps without difficulty due to left hip and knee strength 5/5    Time 8    Period Weeks    Status New    Target Date 08/22/19      PT LONG TERM GOAL #5   Title FOTO score </- 23% limitation    Time 8    Period Weeks    Status New    Target Date 08/22/19                 Plan - 07/30/19 1715    Clinical Impression Statement Patient continues to have left quad tightness and pain with declines. She had very good response to DN/manual therapy to left proximal quads today and  reported feeling looser at end of session. Left knee flexion still limited to <= 90 deg.    PT Frequency 2x / week    PT Duration 8 weeks    PT Treatment/Interventions Cryotherapy;Electrical Stimulation;Iontophoresis 4mg /ml Dexamethasone;Moist Heat;Ultrasound;Neuromuscular re-education;Therapeutic exercise;Therapeutic activities;Gait training;Patient/family education;Manual techniques;Dry needling;Passive range of motion;Taping    PT Next Visit Plan Assess estim/DN response; DN Lt proximal quad as needed, STM using spikey roller/stripping/active release, Lt thigh stretching off table and standing with strap, eccentric strength of Lt quad    PT Home Exercise Plan Access Code: [email or web address]           Patient will benefit from skilled therapeutic intervention in order to improve the following deficits and impairments:  Decreased range of motion, Difficulty walking, Increased fascial restricitons, Pain, Decreased strength, Decreased mobility  Visit Diagnosis: Muscle weakness (generalized)  Pain in left thigh     Problem List Patient Active Problem List   Diagnosis Date Noted  . Quadriceps strain, left, initial encounter 05/26/2019    Madelyn Flavors PT 07/30/2019, 5:22 PM  [facility] [address], [place], [place], [address] Phone: [phone]   Fax:  [phone]  Name: Evalin Shawhan MRN: [id number] Date of Birth: 04-10-1945

## 2019-08-06 ENCOUNTER — Encounter: Payer: Self-pay | Admitting: Family Medicine

## 2019-08-06 ENCOUNTER — Ambulatory Visit: Payer: Medicare HMO | Admitting: Physical Therapy

## 2019-08-06 ENCOUNTER — Encounter: Payer: Self-pay | Admitting: Physical Therapy

## 2019-08-06 ENCOUNTER — Other Ambulatory Visit: Payer: Self-pay

## 2019-08-06 ENCOUNTER — Ambulatory Visit: Payer: Medicare HMO | Admitting: Family Medicine

## 2019-08-06 DIAGNOSIS — S76112A Strain of left quadriceps muscle, fascia and tendon, initial encounter: Secondary | ICD-10-CM

## 2019-08-06 DIAGNOSIS — M79652 Pain in left thigh: Secondary | ICD-10-CM | POA: Diagnosis not present

## 2019-08-06 DIAGNOSIS — M6281 Muscle weakness (generalized): Secondary | ICD-10-CM

## 2019-08-06 NOTE — Progress Notes (Signed)
[facility] [address] [place] [address] Phone: [phone] Subjective:   [name], am serving as a scribe for Dr. Antoine Primas. This visit occurred during the SARS-CoV-2 public health emergency.  Safety protocols were in place, including screening questions prior to the visit, additional usage of staff PPE, and extensive cleaning of exam room while observing appropriate contact time as indicated for disinfecting solutions.   I'm seeing this patient by the request  of:  Blair Heys, MD  CC: Quadricep follow-up  IFO:[id number]   5/19/20021 Patient is making some progress.  He is having some cramping in the leg.  Does have some lumbar radiculopathy but I do think is contributing as well.  Discussed which activities to do which wants to avoid.  Increase activity slowly.  Patient will start with formal physical therapy to try to help with the range of motion and strengthening focusing on eccentric's.  Discussed the medications again at great length including the Relafen.  Follow-up again 4 to 8 weeks.  Discussion with patients with athletic trainer, myself, reviewing patient's imaging total today 33 minutes  Update 08/06/2019 [name] is a 74 y.o. female coming in with complaint of left quad strain. Patient states that she was doing physical therapy. Today she was discharged. Had dry needling last week which alleviated the knot in quad. Range of motion has improved. Very little pain throughout the day and during paddling.  Patient states that she is happy with the results.  Been discharged from physical therapy      No past medical history on file. No past surgical history on file. Social History   Socioeconomic History   Marital status: Widowed    Spouse name: Not on file   Number of children: Not on file   Years of education: Not on file   Highest education level: Not on file  Occupational History   Not on file    Tobacco Use   Smoking status: Not on file  Substance and Sexual Activity   Alcohol use: Not on file   Drug use: Not on file   Sexual activity: Not on file  Other Topics Concern   Not on file  Social History Narrative   Not on file   Social Determinants of Health   Financial Resource Strain:    Difficulty of Paying Living Expenses:   Food Insecurity:    Worried About Running Out of Food in the Last Year:    Ran Out of Food in the Last Year:   Transportation Needs:    Freight forwarder (Medical):    Lack of Transportation (Non-Medical):   Physical Activity:    Days of Exercise per Week:    Minutes of Exercise per Session:   Stress:    Feeling of Stress :   Social Connections:    Frequency of Communication with Friends and Family:    Frequency of Social Gatherings with Friends and Family:    Attends Religious Services:    Active Member of Clubs or Organizations:    Attends Banker Meetings:    Marital Status:    Not on File No family history on file.     Current Outpatient Medications (Analgesics):    ibuprofen (ADVIL) 200 MG tablet, Take 200 mg by mouth in the morning and at bedtime.   nabumetone (RELAFEN) 500 MG tablet, Take 500 mg by mouth daily.   Current Outpatient Medications (Other):    bisacodyl (DULCOLAX)  5 MG EC tablet, Take 5 mg by mouth daily as needed for moderate constipation.   cholecalciferol (VITAMIN D3) 25 MCG (1000 UNIT) tablet, Take 1,000 Units by mouth daily.   magnesium gluconate (MAGONATE) 500 MG tablet, Take 500 mg by mouth daily.   Melatonin 10 MG TABS, Take by mouth.   pantoprazole (PROTONIX) 40 MG tablet, Take 40 mg by mouth daily.   pregabalin (LYRICA) 150 MG capsule, Take 150 mg by mouth 2 (two) times daily.   Reviewed prior external information including notes and imaging from  primary care provider As well as notes that were available from care everywhere and other healthcare  systems.  Past medical history, social, surgical and family history all reviewed in electronic medical record.  No pertanent information unless stated regarding to the chief complaint.   Review of Systems:  No headache, visual changes, nausea, vomiting, diarrhea, constipation, dizziness, abdominal pain, skin rash, fevers, chills, night sweats, weight loss, swollen lymph nodes, body aches, joint swelling, chest pain, shortness of breath, mood changes. POSITIVE muscle aches  Objective  Blood pressure 122/72, pulse 81, height 5\' 9"  (1.753 m), weight 175 lb (79.4 kg), SpO2 96 %.   General: No apparent distress alert and oriented x3 mood and affect normal, dressed appropriately.  HEENT: Pupils equal, extraocular movements intact  Respiratory: Patient's speak in full sentences and does not appear short of breath  Cardiovascular: No lower extremity edema, non tender, no erythema  Neuro: Cranial nerves II through XII are intact, neurovascularly intact in all extremities with 2+ DTRs and 2+ pulses.  Gait normal with good balance and coordination.  MSK: Still left leg is mildly weak compared to the contralateral side.  Patient has no tenderness over the quadricep tendon at this time.  Patient has full strength of the quadricep.  Full range of motion as expected.    Impression and Recommendations:     The above documentation has been reviewed and is accurate and complete , DO       Note: This dictation was prepared with Dragon dictation along with smaller phrase technology. Any transcriptional errors that result from this process are unintentional.

## 2019-08-06 NOTE — Patient Instructions (Signed)
Access Code: [email or web address] URL: [email or web address] Date: 08/06/2019 Prepared by: Raynelle Fanning  Exercises Supine Quadriceps Stretch with Strap on Table - 2 x daily - 7 x weekly - 1 sets - 2 reps Standing Hip Flexor Stretch - 2 x daily - 7 x weekly - 1 sets - 2 reps Active Straight Leg Raise with Quad Set - 1 x daily - 7 x weekly - 2 sets - 15 reps Supine Knee to Chest with Leg Straight - 1 x daily - 7 x weekly - 1 sets - 15 reps Seated Knee Extension with Resistance - 1 x daily - 7 x weekly - 2 sets - 10 reps Sideways Step Touch - 1 x daily - 7 x weekly - 2 sets - 10 reps Standing Hip Abduction with Counter Support - 1 x daily - 7 x weekly - 3 sets - 10 reps Sit to Stand with Resistance Around Legs - 2 x daily - 7 x weekly - 1 sets - 10 reps Forward Step Down with Heel Tap and Counter Support - 1 x daily - 7 x weekly - 1-2 sets - 10 reps

## 2019-08-06 NOTE — Therapy (Signed)
[facility] [address], [place], [place], [address] Phone: [phone]   Fax:  [phone]  Physical Therapy Treatment and Discharge Summary  Patient Details  Name: [name] MRN: [id number] Date of Birth: 09-Jul-1945 Referring Provider (PT): Dr. Lyndal Pulley   Encounter Date: 08/06/2019   PT End of Session - 08/06/19 1019    Visit Number 6    Date for PT Re-Evaluation 08/22/19    Authorization Type [employer] Medicare    PT Start Time 1014    PT Stop Time 1101    PT Time Calculation (min) 47 min    Activity Tolerance Patient tolerated treatment well;No increased pain    Behavior During Therapy [facility] for tasks assessed/performed           History reviewed. No pertinent past medical history.  History reviewed. No pertinent surgical history.  There were no vitals filed for this visit.   Subjective Assessment - 08/06/19 1019    Subjective 90% better since last visit. Able to descend driveway without noticing thigh pain. I'm ready for this to be my last visit.    Patient Stated Goals reduce pain    Currently in Pain? Yes    Pain Score 2     Pain Location Leg    Pain Orientation Left    Pain Descriptors / Indicators Aching              [facility] PT Assessment - 08/06/19 0001      ROM / Strength   AROM / PROM / Strength AROM      AROM   Left Knee Flexion 98   prone                        [facility] Adult PT Treatment/Exercise - 08/06/19 0001      Lumbar Exercises: Aerobic   Recumbent Bike L2 x 5 min      Knee/Hip Exercises: Stretches   Sports administrator Left;1 rep;20 seconds    Quad Stretch Limitations prone    Hip Flexor Stretch Left;2 reps;60 seconds    Hip Flexor Stretch Limitations with strap off EOB    Knee: Self-Stretch to increase Flexion Left;5 reps   on second step   Knee: Self-Stretch Limitations 10 reps      Knee/Hip Exercises: Standing   Hip Abduction Both;1 set;10 reps     Step Down Left;2 sets;10 reps;Hand Hold: 2;Step Height: 6"    Step Down Limitations second set 1x8      Knee/Hip Exercises: Seated   Sit to Sand 10 reps   5 with red band; hips ADDuct and IR     Manual Therapy   Manual Therapy Soft tissue mobilization;Joint mobilization    Manual therapy comments skilled palpation and monitoring of soft tissues during DN    Soft tissue mobilization to left quads            Trigger Point Dry Needling - 08/06/19 0001    Consent Given? Yes    Education Handout Provided Previously provided    Muscles Treated Lower Quadrant Rectus femoris;Vastus intermedius    Electrical Stimulation Performed with Dry Needling Yes    E-stim with Dry Needling Details 10 freq x 5 min    Other Dry Needling left prox and mid    Rectus femoris Response Twitch response elicited;Palpable increased muscle length    Vastus intermedius Response Twitch response elicited;Palpable increased muscle length  PT Education - 08/06/19 1550    Education Details HEP finalized    Person(s) Educated Patient    Methods Explanation;Demonstration;Handout    Comprehension Verbalized understanding;Returned demonstration            PT Short Term Goals - 08/06/19 1021      PT SHORT TERM GOAL #1   Title independent with initial HEP    Status Achieved    Target Date 07/25/19             PT Long Term Goals - 08/06/19 1021      PT LONG TERM GOAL #1   Title independent with advanced HEP    Status Achieved      PT LONG TERM GOAL #2   Title prone knee flexion A/ROM >/= 110 degrees due to elongation of the left quadricep    Status Not Met      PT LONG TERM GOAL #3   Title able to walk up and down an incline without difficulty or pain due to decreased trigger points in the left quadriceps    Status Achieved      PT LONG TERM GOAL #4   Title able to go up and down steps without difficulty due to left hip and knee strength 5/5    Status Partially Met       PT LONG TERM GOAL #5   Title FOTO score </- 23% limitation    Baseline 1% limitation    Status Achieved                 Plan - 08/06/19 1552    Clinical Impression Statement Patient presents today with reports of significant improvement in left thigh pain and is ready for discharge. She has met or partially met all LTGs. Her prone knee flexion has improved to 98 deg actively and should continue to improve with stretching. She demonstrates weakness in her quads eccentrically with descending stairs and bil hip weakness with sit to stands. HEP was progressed to address these deficits. Patient is pleased with current functional level.    Rehab Potential Excellent    PT Frequency 2x / week    PT Duration 8 weeks    PT Treatment/Interventions Cryotherapy;Electrical Stimulation;Iontophoresis 27m/ml Dexamethasone;Moist Heat;Ultrasound;Neuromuscular re-education;Therapeutic exercise;Therapeutic activities;Gait training;Patient/family education;Manual techniques;Dry needling;Passive range of motion;Taping    PT Next Visit Plan see D/C summary    PT Home Exercise Plan Access Code: [email or web address]          Patient will benefit from skilled therapeutic intervention in order to improve the following deficits and impairments:  Decreased range of motion, Difficulty walking, Increased fascial restricitons, Pain, Decreased strength, Decreased mobility  Visit Diagnosis: Muscle weakness (generalized)  Pain in left thigh     Problem List Patient Active Problem List   Diagnosis Date Noted   Quadriceps strain, left, initial encounter 05/26/2019   JMadelyn FlavorsPT 08/06/2019, 4:01 PM   [facility] [address] [place] [address]: [phone]  Fax:  [phone] Name: Caitlyn CancioMRN: [id number] of Birth: [birth year]  PHYSICAL THERAPY DISCHARGE SUMMARY  Visits from Start of Care: 6  Current  functional level related to goals / functional outcomes: See above   Remaining deficits: See above   Education / Equipment: HEP Plan: Patient agrees to discharge.  Patient goals were partially met. Patient is being discharged due to being pleased with the current functional level.  ?????  Madelyn Flavors, PT 08/06/19 4:02 PM [facility] [address], [place], [place] [address] Phone # [phone] Fax [phone]

## 2019-08-06 NOTE — Assessment & Plan Note (Signed)
Patient is 100% better at this time.  Still has some mild weakness of this leg compared to the contralateral side but that secondary to patient's back radicular symptoms.  Patient does able to do all daily activities as well as being her kayak on a regular basis.  Follow-up again as needed

## 2019-09-07 ENCOUNTER — Ambulatory Visit (HOSPITAL_COMMUNITY)
Admission: EM | Admit: 2019-09-07 | Discharge: 2019-09-07 | Disposition: A | Discharge status: Home / Self Care | Payer: Medicare HMO | Attending: Family Medicine | Admitting: Family Medicine

## 2019-09-07 ENCOUNTER — Encounter (HOSPITAL_COMMUNITY): Payer: Self-pay

## 2019-09-07 ENCOUNTER — Other Ambulatory Visit: Payer: Self-pay

## 2019-09-07 DIAGNOSIS — M79672 Pain in left foot: Secondary | ICD-10-CM | POA: Diagnosis not present

## 2019-09-07 HISTORY — DX: Gastro-esophageal reflux disease without esophagitis: K21.9

## 2019-09-07 HISTORY — DX: Constipation, unspecified: K59.00

## 2019-09-07 MED ORDER — PREDNISONE 5 MG PO TABS: ORAL_TABLET | ORAL | 0 refills | Status: DC

## 2019-09-07 NOTE — ED Triage Notes (Signed)
Pt went camping and got several insect bites on ankles and feet bilat. Pt has 1+ edema of left foot, 2+ left pedal pulse, cap refill less than 3 sec. Pt has several bumps on feet and ankles and pt states they itch. Pt states she has tried several OTC measures like benadryl, ice and it hasn't helped.

## 2019-09-07 NOTE — ED Provider Notes (Signed)
[facility]    CSN: [id number] Arrival date & time: 09/07/19  1254      History   Chief Complaint Chief Complaint  Patient presents with  . insect bite to ankles    HPI [name] is a 74 y.o. female.   Presenting with left foot and ankle pain.  She was kayaking yesterday and outside.  She thinks she is having different insect bite of an unknown insect.  She is having some redness and swelling of the left foot that is not as evident in the right foot.  She denies having allergy to poison ivy.  HPI  Past Medical History:  Diagnosis Date  . Constipation   . GERD (gastroesophageal reflux disease)     Patient Active Problem List   Diagnosis Date Noted  . Quadriceps strain, left, initial encounter 05/26/2019    Past Surgical History:  Procedure Laterality Date  . BACK SURGERY    . CHOLECYSTECTOMY      OB History   No obstetric history on file.      Home Medications    Prior to Admission medications   Medication Sig Start Date End Date Taking? Authorizing Provider  bisacodyl (DULCOLAX) 5 MG EC tablet Take 5 mg by mouth daily as needed for moderate constipation.    [provider]  cholecalciferol (VITAMIN D3) 25 MCG (1000 UNIT) tablet Take 1,000 Units by mouth daily.    [provider]  ibuprofen (ADVIL) 200 MG tablet Take 200 mg by mouth in the morning and at bedtime.    [provider]  magnesium gluconate (MAGONATE) 500 MG tablet Take 500 mg by mouth daily.    [provider]  Melatonin 10 MG TABS Take by mouth.    [provider]  nabumetone (RELAFEN) 500 MG tablet Take 500 mg by mouth daily.    [provider]  pantoprazole (PROTONIX) 40 MG tablet Take 40 mg by mouth daily.    [provider]  predniSONE (DELTASONE) 5 MG tablet Take 6 pills for first day, 5 pills second day, 4 pills third day, 3 pills fourth day, 2 pills the fifth day, and 1 pill sixth day. 09/07/19   Myra Rude, MD  pregabalin (LYRICA) 150 MG capsule Take 150 mg by mouth 2 (two) times daily.    [provider]    Family History No family history on file.  Social History Social History   Tobacco Use  . Smoking status: Former Games developer  . Smokeless tobacco: Never Used  Substance Use Topics  . Alcohol use: Yes    Comment: occ  . Drug use: Never     Allergies   Morphine and related   Review of Systems Review of Systems  See HPI  Physical Exam Triage Vital Signs ED Triage Vitals [09/07/19 1326]  Enc Vitals Group     BP (!) 128/62     Pulse Rate 77     Resp 16     Temp 98.5 F (36.9 C)     Temp Source Oral     SpO2 98 %     Weight 175 lb (79.4 kg)     Height 5' 9.5" (1.765 m)     Head Circumference      Peak Flow      Pain Score 0     Pain Loc      Pain Edu?      Excl. in GC?    No data found.  Updated Vital Signs BP (!) 128/62   Pulse 77   Temp 98.5 F (36.9 C) (Oral)   Resp 16   Ht 5' 9.5" (1.765 m)   Wt 79.4 kg   SpO2 98%   BMI 25.47 kg/m   Visual Acuity Right Eye Distance:   Left Eye Distance:   Bilateral Distance:    Right Eye Near:   Left Eye Near:    Bilateral Near:     Physical Exam Gen: NAD, alert, cooperative with exam, well-appearing ENT: normal lips, normal nasal mucosa,  Eye: normal EOM, normal conjunctiva and lids Skin: Different areas of insect bites on the left foot, left foot is having dorsal swelling and mildly redness, no streaking or specific area of induration Neuro: normal tone, normal sensation to touch Psych:  normal insight, alert and oriented MSK:  Left ankle and foot: Normal range of motion. Neurovascularly intact   UC Treatments / Results  Labs (all labs ordered are listed, but only abnormal results are displayed) Labs Reviewed - No data to display  EKG   Radiology No results found.  Procedures Procedures (including critical care time)  Medications Ordered in UC Medications - No data to  display  Initial Impression / Assessment and Plan / UC Course  I have reviewed the triage vital signs and the nursing notes.  Pertinent labs & imaging results that were available during my care of the patient were reviewed by me and considered in my medical decision making (see chart for details).     [name] is a 74 year old female that is presenting with left foot pain.  She has exposure to possible insect bites.  Seems less likely for an atopic dermatitis.  Having some swelling on the left is more prevalent in the right foot.  Has tried over-the-counter medications with limited improvement.  Will provide prednisone.  Counseled on close follow-up.  Seems less likely for infection or gout.  Given indications to return.  Final Clinical Impressions(s) / UC Diagnoses   Final diagnoses:  Left foot pain     Discharge Instructions     Please try the medicine  Please try compression and elevation  Please follow up if your symptoms fail to improve.     ED Prescriptions    Medication Sig Dispense Auth. Provider   predniSONE (DELTASONE) 5 MG tablet Take 6 pills for first day, 5 pills second day, 4 pills third day, 3 pills fourth day, 2 pills the fifth day, and 1 pill sixth day. 21 tablet Myra Rude, MD     PDMP not reviewed this encounter.   Myra Rude, MD 09/07/19 [phone]

## 2019-09-07 NOTE — Discharge Instructions (Signed)
Please try the medicine  Please try compression and elevation  Please follow up if your symptoms fail to improve.

## 2019-10-23 ENCOUNTER — Other Ambulatory Visit: Payer: Self-pay | Admitting: Family Medicine

## 2019-10-23 DIAGNOSIS — Z1389 Encounter for screening for other disorder: Secondary | ICD-10-CM | POA: Diagnosis not present

## 2019-10-23 DIAGNOSIS — E559 Vitamin D deficiency, unspecified: Secondary | ICD-10-CM | POA: Diagnosis not present

## 2019-10-23 DIAGNOSIS — Z86010 Personal history of colonic polyps: Secondary | ICD-10-CM | POA: Diagnosis not present

## 2019-10-23 DIAGNOSIS — Z1231 Encounter for screening mammogram for malignant neoplasm of breast: Secondary | ICD-10-CM

## 2019-10-23 DIAGNOSIS — M8580 Other specified disorders of bone density and structure, unspecified site: Secondary | ICD-10-CM

## 2019-10-23 DIAGNOSIS — M899 Disorder of bone, unspecified: Secondary | ICD-10-CM | POA: Diagnosis not present

## 2019-10-23 DIAGNOSIS — M5416 Radiculopathy, lumbar region: Secondary | ICD-10-CM | POA: Diagnosis not present

## 2019-10-23 DIAGNOSIS — K219 Gastro-esophageal reflux disease without esophagitis: Secondary | ICD-10-CM | POA: Diagnosis not present

## 2019-10-23 DIAGNOSIS — E7800 Pure hypercholesterolemia, unspecified: Secondary | ICD-10-CM | POA: Diagnosis not present

## 2019-10-23 DIAGNOSIS — Z0000 Encounter for general adult medical examination without abnormal findings: Secondary | ICD-10-CM | POA: Diagnosis not present

## 2019-10-23 DIAGNOSIS — Z131 Encounter for screening for diabetes mellitus: Secondary | ICD-10-CM | POA: Diagnosis not present

## 2020-02-05 ENCOUNTER — Other Ambulatory Visit: Payer: Medicare HMO

## 2020-02-05 ENCOUNTER — Ambulatory Visit: Payer: Medicare HMO

## 2020-02-10 DIAGNOSIS — Z20822 Contact with and (suspected) exposure to covid-19: Secondary | ICD-10-CM | POA: Diagnosis not present

## 2020-05-24 ENCOUNTER — Other Ambulatory Visit: Payer: Self-pay

## 2020-05-24 ENCOUNTER — Ambulatory Visit
Admission: RE | Admit: 2020-05-24 | Discharge: 2020-05-24 | Disposition: A | Discharge status: Home / Self Care | Payer: Medicare HMO | Source: Ambulatory Visit | Attending: Family Medicine | Admitting: Family Medicine

## 2020-05-24 DIAGNOSIS — Z1231 Encounter for screening mammogram for malignant neoplasm of breast: Secondary | ICD-10-CM

## 2020-05-24 DIAGNOSIS — M85851 Other specified disorders of bone density and structure, right thigh: Secondary | ICD-10-CM | POA: Diagnosis not present

## 2020-05-24 DIAGNOSIS — M8580 Other specified disorders of bone density and structure, unspecified site: Secondary | ICD-10-CM

## 2020-05-24 DIAGNOSIS — Z780 Asymptomatic menopausal state: Secondary | ICD-10-CM | POA: Diagnosis not present

## 2020-05-31 DIAGNOSIS — H2513 Age-related nuclear cataract, bilateral: Secondary | ICD-10-CM | POA: Diagnosis not present

## 2020-05-31 DIAGNOSIS — H5203 Hypermetropia, bilateral: Secondary | ICD-10-CM | POA: Diagnosis not present

## 2020-06-01 ENCOUNTER — Other Ambulatory Visit: Payer: Self-pay | Admitting: Internal Medicine

## 2020-06-01 DIAGNOSIS — N6312 Unspecified lump in the right breast, upper inner quadrant: Secondary | ICD-10-CM

## 2020-06-01 DIAGNOSIS — K121 Other forms of stomatitis: Secondary | ICD-10-CM | POA: Diagnosis not present

## 2020-06-01 DIAGNOSIS — R197 Diarrhea, unspecified: Secondary | ICD-10-CM | POA: Diagnosis not present

## 2020-07-07 ENCOUNTER — Ambulatory Visit
Admission: RE | Admit: 2020-07-07 | Discharge: 2020-07-07 | Disposition: A | Discharge status: Home / Self Care | Payer: Medicare HMO | Source: Ambulatory Visit | Attending: Internal Medicine | Admitting: Internal Medicine

## 2020-07-07 ENCOUNTER — Other Ambulatory Visit: Payer: Self-pay

## 2020-07-07 DIAGNOSIS — R928 Other abnormal and inconclusive findings on diagnostic imaging of breast: Secondary | ICD-10-CM | POA: Diagnosis not present

## 2020-07-07 DIAGNOSIS — N6314 Unspecified lump in the right breast, lower inner quadrant: Secondary | ICD-10-CM | POA: Diagnosis not present

## 2020-07-07 DIAGNOSIS — N6312 Unspecified lump in the right breast, upper inner quadrant: Secondary | ICD-10-CM

## 2020-07-12 DIAGNOSIS — T8543XA Leakage of breast prosthesis and implant, initial encounter: Secondary | ICD-10-CM | POA: Diagnosis not present

## 2020-08-20 ENCOUNTER — Ambulatory Visit (INDEPENDENT_AMBULATORY_CARE_PROVIDER_SITE_OTHER): Payer: Medicare HMO | Admitting: Plastic Surgery

## 2020-08-20 ENCOUNTER — Other Ambulatory Visit: Payer: Self-pay

## 2020-08-20 ENCOUNTER — Encounter: Payer: Self-pay | Admitting: Plastic Surgery

## 2020-08-20 DIAGNOSIS — T8543XA Leakage of breast prosthesis and implant, initial encounter: Secondary | ICD-10-CM | POA: Diagnosis not present

## 2020-08-20 NOTE — Progress Notes (Signed)
Patient ID: [name], female    DOB: [birth year], 74 y.o.   MRN: [id number]   Chief Complaint  Patient presents with   Advice Only    The patient is a 75 year old female here for evaluation of her breasts.  She underwent silicone implant placement in 1979.  This was done in [place].  She has not had any surgery since then.  She had some imaging done a month ago which showed extracapsular rupture of the right implant.  Both implants are very firm and likely both of them are ruptured.  She has a little bit of ptosis.  She is 5 feet 9 inches tall weighs 165 pounds.  She was nurse anesthetist for a number of years.  She is very active and in good health.  No sign of infection.  She does not have her implant card and so she is not sure about what size she is.   Review of Systems  Constitutional: Negative.   HENT: Negative.    Eyes: Negative.   Respiratory: Negative.  Negative for chest tightness.   Cardiovascular: Negative.  Negative for leg swelling.  Gastrointestinal:  Negative for abdominal pain.  Endocrine: Negative.   Genitourinary: Negative.   Musculoskeletal: Negative.   Skin: Negative.  Negative for wound.  Neurological: Negative.   Hematological: Negative.   Psychiatric/Behavioral: Negative.     Past Medical History:  Diagnosis Date   Constipation    GERD (gastroesophageal reflux disease)     Past Surgical History:  Procedure Laterality Date   BACK SURGERY     CHOLECYSTECTOMY        Current Outpatient Medications:    bisacodyl (DULCOLAX) 5 MG EC tablet, Take 5 mg by mouth daily as needed for moderate constipation., Disp: , Rfl:    cholecalciferol (VITAMIN D3) 25 MCG (1000 UNIT) tablet, Take 1,000 Units by mouth daily., Disp: , Rfl:    ibuprofen (ADVIL) 200 MG tablet, Take 200 mg by mouth in the morning and at bedtime., Disp: , Rfl:    magnesium gluconate (MAGONATE) 500 MG tablet, Take 500 mg by mouth daily., Disp: , Rfl:    Melatonin 10 MG TABS, Take by  mouth., Disp: , Rfl:    nabumetone (RELAFEN) 500 MG tablet, Take 500 mg by mouth daily., Disp: , Rfl:    pantoprazole (PROTONIX) 40 MG tablet, Take 40 mg by mouth daily., Disp: , Rfl:    pregabalin (LYRICA) 150 MG capsule, Take 150 mg by mouth 2 (two) times daily., Disp: , Rfl:    Objective:   Vitals:   08/20/20 1120  BP: (!) 153/67  Pulse: 77  SpO2: 94%    Physical Exam Vitals and nursing note reviewed.  Constitutional:      Appearance: Normal appearance.  HENT:     Head: Normocephalic and atraumatic.  Eyes:     Extraocular Movements: Extraocular movements intact.  Cardiovascular:     Rate and Rhythm: Normal rate.     Pulses: Normal pulses.  Pulmonary:     Effort: Pulmonary effort is normal. No respiratory distress.  Abdominal:     General: Abdomen is flat. There is no distension.     Tenderness: There is no abdominal tenderness.  Skin:    General: Skin is warm.     Capillary Refill: Capillary refill takes less than 2 seconds.     Coloration: Skin is not jaundiced.     Findings: No bruising.  Neurological:     General: No focal  deficit present.     Mental Status: She is alert and oriented to person, place, and time.  Psychiatric:        Mood and Affect: Mood normal.        Behavior: Behavior normal.    Assessment & Plan:  Ruptured silicone breast implant, initial encounter  Plan for removal of ruptured silicone implants.  Patient does not want to have any other implants put in and is not interested in a mastopexy at this time.  We will provide her with a quote.  Pictures were obtained of the patient and placed in the chart with the patient's or guardian's permission.   Alena Bills Ivanna Kocak, DO

## 2020-09-23 NOTE — Progress Notes (Signed)
Patient ID: [name], female    DOB: 07-17-1945, 75 y.o.   MRN: [id number]  Chief Complaint  Patient presents with   Pre-op Exam       ICD-10-CM   1. Ruptured silicone breast implant, initial encounter  T85.43XA        History of Present Illness: [name] is a 75 y.o.  female  with a history of bilateral breast augmentation in 1979.  She presents for preoperative evaluation for upcoming procedure, removal of bilateral breast implants and bilateral capsulectomy, scheduled for 10/06/2020 with Dr. Ulice Bold.  The patient has not had problems with anesthesia. No history of DVT/PE.  No family history of DVT/PE.  No family or personal history of bleeding or clotting disorders.  Patient is not currently taking any blood thinners.  No history of CVA/MI.   Summary of Previous Visit: Patient had extracapsular rupture of the right implants, both implants are very firm and both are likely ruptured.  Job: Retired, does Nurse, adult  PMH Significant for: GERD  No recent changes in patient's health.  No infectious symptoms.  No cardiac or pulmonary conditions.   Past Medical History: Allergies: Allergies  Allergen Reactions   Morphine And Related Swelling    Current Medications:  Current Outpatient Medications:    bisacodyl (DULCOLAX) 5 MG EC tablet, Take 5 mg by mouth daily as needed for moderate constipation., Disp: , Rfl:    cholecalciferol (VITAMIN D3) 25 MCG (1000 UNIT) tablet, Take 1,000 Units by mouth daily., Disp: , Rfl:    ibuprofen (ADVIL) 200 MG tablet, Take 200 mg by mouth in the morning and at bedtime., Disp: , Rfl:    magnesium gluconate (MAGONATE) 500 MG tablet, Take 500 mg by mouth daily., Disp: , Rfl:    Melatonin 10 MG TABS, Take by mouth., Disp: , Rfl:    nabumetone (RELAFEN) 500 MG tablet, Take 500 mg by mouth daily., Disp: , Rfl:    pantoprazole (PROTONIX) 40 MG tablet, Take 40 mg by mouth daily., Disp: , Rfl:    pregabalin  (LYRICA) 150 MG capsule, Take 150 mg by mouth 2 (two) times daily., Disp: , Rfl:   Past Medical Problems: Past Medical History:  Diagnosis Date   Constipation    GERD (gastroesophageal reflux disease)     Past Surgical History: Past Surgical History:  Procedure Laterality Date   BACK SURGERY     CHOLECYSTECTOMY      Social History: Social History   Socioeconomic History   Marital status: Widowed    Spouse name: Not on file   Number of children: Not on file   Years of education: Not on file   Highest education level: Not on file  Occupational History   Not on file  Tobacco Use   Smoking status: Former   Smokeless tobacco: Never  Substance and Sexual Activity   Alcohol use: Yes    Comment: occ   Drug use: Never   Sexual activity: Not on file  Other Topics Concern   Not on file  Social History Narrative   Not on file   Social Determinants of Health   Financial Resource Strain: Not on file  Food Insecurity: Not on file  Transportation Needs: Not on file  Physical Activity: Not on file  Stress: Not on file  Social Connections: Not on file  Intimate Partner Violence: Not on file    Family History: No family history on file.  Review of Systems: Review  of Systems  Constitutional: Negative.   Respiratory: Negative.    Cardiovascular: Negative.   Gastrointestinal: Negative.    Physical Exam: Vital Signs BP 123/66 (BP Location: Left Arm, Patient Position: Sitting, Cuff Size: Normal)   Pulse 76   Ht 5\' 9"  (1.753 m)   Wt 165 lb 3.2 oz (74.9 kg)   SpO2 97%   BMI 24.40 kg/m   Physical Exam Constitutional:      General: Not in acute distress.    Appearance: Normal appearance. Not ill-appearing.  HENT:     Head: Normocephalic and atraumatic.  Eyes:     Pupils: Pupils are equal, round Neck:     Musculoskeletal: Normal range of motion.  Cardiovascular:     Rate and Rhythm: Normal rate    Pulses: Normal pulses.  Pulmonary:     Effort: Pulmonary effort  is normal. No respiratory distress.  Musculoskeletal: Normal range of motion.  Skin:    General: Skin is warm and dry.     Findings: No erythema or rash.  Neurological:     General: No focal deficit present.     Mental Status: Alert and oriented to person, place, and time. Mental status is at baseline.     Motor: No weakness.  Psychiatric:        Mood and Affect: Mood normal.        Behavior: Behavior normal.    Assessment/Plan: The patient is scheduled for removal of bilateral ruptured breast implants and bilateral capsulectomies with Dr. .  Risks, benefits, and alternatives of procedure discussed, questions answered and consent obtained.    Smoking Status: Former smoker; Counseling Given?  Quit 40 years ago Last Mammogram: 07/07/2020 with subsequent ultrasound of right breast; Results: Extracapsular rupture of right retroglandular implant, intracapsular rupture of left retroglandular implant. no evidence of malignancies.  Caprini Score: 4, moderate; Risk Factors include: Age, and length of planned surgery. Recommendation for mechanical prophylaxis. Encourage early ambulation.   Pictures obtained: @consult   Post-op Rx sent to pharmacy: Norco, Zofran  Patient was provided with the General Surgical Risk consent document and Pain Medication Agreement prior to their appointment.  They had adequate time to read through the risk consent documents and Pain Medication Agreement. We also discussed them in person together during this preop appointment. All of their questions were answered to their satisfaction.  Recommended calling if they have any further questions.  Risk consent form and Pain Medication Agreement to be scanned into patient's chart.  Recommend holding NSAIDs 1 week prior to surgery.   Electronically signed by: 09/06/2020 Kayloni Rocco, PA-C 09/24/2020 1:04 PM

## 2020-09-24 ENCOUNTER — Other Ambulatory Visit: Payer: Self-pay

## 2020-09-24 ENCOUNTER — Encounter: Payer: Self-pay | Admitting: Surgical

## 2020-09-24 ENCOUNTER — Ambulatory Visit (INDEPENDENT_AMBULATORY_CARE_PROVIDER_SITE_OTHER): Payer: Medicare HMO | Admitting: Surgical

## 2020-09-24 VITALS — BP 123/66 | HR 76 | Ht 69.0 in | Wt 165.2 lb

## 2020-09-24 DIAGNOSIS — T8543XA Leakage of breast prosthesis and implant, initial encounter: Secondary | ICD-10-CM

## 2020-10-06 ENCOUNTER — Other Ambulatory Visit: Payer: Self-pay | Admitting: Plastic Surgery

## 2020-10-06 ENCOUNTER — Telehealth: Payer: Self-pay | Admitting: Plastic Surgery

## 2020-10-06 DIAGNOSIS — T8543XA Leakage of breast prosthesis and implant, initial encounter: Secondary | ICD-10-CM | POA: Diagnosis not present

## 2020-10-06 DIAGNOSIS — T8549XA Other mechanical complication of breast prosthesis and implant, initial encounter: Secondary | ICD-10-CM | POA: Diagnosis not present

## 2020-10-06 DIAGNOSIS — T85898A Other specified complication of other internal prosthetic devices, implants and grafts, initial encounter: Secondary | ICD-10-CM | POA: Diagnosis not present

## 2020-10-06 NOTE — Telephone Encounter (Signed)
Please send prescriptions to [facility] on [address] Please call to update [phone]. Thank you

## 2020-10-06 NOTE — Telephone Encounter (Signed)
Returned patients call. Advised her I will send a message to PA to have the Narco and Zofran sent to her pharmacy. She indicated Narco makes her feel nauseated sometimes. Suggested to alternate Tylenol with Ibuprofen first for pain, take the Narco (if needed) prior to bedtime with Zofran. Patient understood and agreed with plan.

## 2020-10-08 ENCOUNTER — Other Ambulatory Visit: Payer: Self-pay | Admitting: Physician Assistant

## 2020-10-08 MED ORDER — HYDROCODONE-ACETAMINOPHEN 5-325 MG PO TABS: 1.0000 | ORAL_TABLET | Freq: Four times a day (QID) | ORAL | 0 refills | Status: AC | PRN

## 2020-10-08 MED ORDER — ONDANSETRON 4 MG PO TBDP: 4.0000 mg | ORAL_TABLET | Freq: Three times a day (TID) | ORAL | 0 refills | Status: DC | PRN

## 2020-10-08 NOTE — Progress Notes (Signed)
Postop pain medications

## 2020-10-15 ENCOUNTER — Ambulatory Visit (INDEPENDENT_AMBULATORY_CARE_PROVIDER_SITE_OTHER): Payer: Medicare HMO | Admitting: Plastic Surgery

## 2020-10-15 ENCOUNTER — Other Ambulatory Visit: Payer: Self-pay

## 2020-10-15 ENCOUNTER — Encounter: Payer: Self-pay | Admitting: Plastic Surgery

## 2020-10-15 DIAGNOSIS — T8543XA Leakage of breast prosthesis and implant, initial encounter: Secondary | ICD-10-CM

## 2020-10-15 NOTE — Progress Notes (Signed)
The patient is a 75 year old female here for follow-up after undergoing removal of bilateral breast implants.  The drains have been putting out minimal amount.  I went ahead and removed both drains.  She is doing well and very happy.  No sign of infection or seroma.  We will plan to see her back in 1 week.  She is fine to shower.  I removed the dressing and put a fresh Steri-Strip on her incision.  She can leave that until it falls off.

## 2020-10-25 DIAGNOSIS — G722 Myopathy due to other toxic agents: Secondary | ICD-10-CM | POA: Diagnosis not present

## 2020-10-25 DIAGNOSIS — Z1389 Encounter for screening for other disorder: Secondary | ICD-10-CM | POA: Diagnosis not present

## 2020-10-25 DIAGNOSIS — E7800 Pure hypercholesterolemia, unspecified: Secondary | ICD-10-CM | POA: Diagnosis not present

## 2020-10-25 DIAGNOSIS — M5416 Radiculopathy, lumbar region: Secondary | ICD-10-CM | POA: Diagnosis not present

## 2020-10-25 DIAGNOSIS — Z0000 Encounter for general adult medical examination without abnormal findings: Secondary | ICD-10-CM | POA: Diagnosis not present

## 2020-10-25 DIAGNOSIS — E559 Vitamin D deficiency, unspecified: Secondary | ICD-10-CM | POA: Diagnosis not present

## 2020-10-25 DIAGNOSIS — F321 Major depressive disorder, single episode, moderate: Secondary | ICD-10-CM | POA: Diagnosis not present

## 2020-10-25 DIAGNOSIS — Z131 Encounter for screening for diabetes mellitus: Secondary | ICD-10-CM | POA: Diagnosis not present

## 2020-10-25 DIAGNOSIS — Z23 Encounter for immunization: Secondary | ICD-10-CM | POA: Diagnosis not present

## 2020-10-25 DIAGNOSIS — Z1331 Encounter for screening for depression: Secondary | ICD-10-CM | POA: Diagnosis not present

## 2020-10-25 DIAGNOSIS — K219 Gastro-esophageal reflux disease without esophagitis: Secondary | ICD-10-CM | POA: Diagnosis not present

## 2020-10-25 DIAGNOSIS — Z86010 Personal history of colonic polyps: Secondary | ICD-10-CM | POA: Diagnosis not present

## 2020-10-25 DIAGNOSIS — M899 Disorder of bone, unspecified: Secondary | ICD-10-CM | POA: Diagnosis not present

## 2020-11-05 ENCOUNTER — Other Ambulatory Visit: Payer: Self-pay

## 2020-11-05 ENCOUNTER — Encounter: Payer: Self-pay | Admitting: Plastic Surgery

## 2020-11-05 ENCOUNTER — Ambulatory Visit (INDEPENDENT_AMBULATORY_CARE_PROVIDER_SITE_OTHER): Payer: Medicare HMO | Admitting: Surgical

## 2020-11-05 DIAGNOSIS — T8543XA Leakage of breast prosthesis and implant, initial encounter: Secondary | ICD-10-CM

## 2020-11-05 NOTE — Progress Notes (Signed)
75 year old female here for follow-up and undergo removal of bilateral breast implants with Dr. Ulice Bold.  Her drains were removed at her last visit.  She is 1 month postop. She is doing well, has no complaints.  Reports she has been kayaking and has not had issues with this.  Chaperone present on exam On exam bilateral breast incisions are intact, NAC is viable.  No subcutaneous fluid collection noted palpation.  No erythema or cellulitic changes noted.   No sign of infection on exam.  Recommend following up as needed.  Patient is aware of restrictions, reports she discussed restrictions with Dr. Ulice Bold at her last appointment.  Recommend following up as needed.  Picture was taken and placed in patient's chart with patient permission.

## 2020-12-14 DIAGNOSIS — K5900 Constipation, unspecified: Secondary | ICD-10-CM | POA: Diagnosis not present

## 2020-12-14 DIAGNOSIS — Z86010 Personal history of colonic polyps: Secondary | ICD-10-CM | POA: Diagnosis not present

## 2020-12-22 DIAGNOSIS — F321 Major depressive disorder, single episode, moderate: Secondary | ICD-10-CM | POA: Diagnosis not present

## 2020-12-22 DIAGNOSIS — R69 Illness, unspecified: Secondary | ICD-10-CM | POA: Diagnosis not present

## 2021-01-20 DIAGNOSIS — F321 Major depressive disorder, single episode, moderate: Secondary | ICD-10-CM | POA: Diagnosis not present

## 2021-01-20 DIAGNOSIS — R69 Illness, unspecified: Secondary | ICD-10-CM | POA: Diagnosis not present

## 2021-04-05 DIAGNOSIS — K649 Unspecified hemorrhoids: Secondary | ICD-10-CM | POA: Diagnosis not present

## 2021-04-05 DIAGNOSIS — Z86010 Personal history of colonic polyps: Secondary | ICD-10-CM | POA: Diagnosis not present

## 2021-04-05 DIAGNOSIS — K5730 Diverticulosis of large intestine without perforation or abscess without bleeding: Secondary | ICD-10-CM | POA: Diagnosis not present

## 2021-06-01 DIAGNOSIS — H2513 Age-related nuclear cataract, bilateral: Secondary | ICD-10-CM | POA: Diagnosis not present

## 2021-06-01 DIAGNOSIS — H5203 Hypermetropia, bilateral: Secondary | ICD-10-CM | POA: Diagnosis not present

## 2021-06-24 IMAGING — US US BREAST*R* LIMITED INC AXILLA
1 series · 6 of 6 positions shown · non-contrast
Comparison: Previous exam(s).

CLINICAL DATA: 75-year-old female with focal thickening within the
INNER RIGHT breast discovered on self-examination. Also for annual
bilateral mammogram.

EXAM:
DIGITAL DIAGNOSTIC BILATERAL MAMMOGRAM WITH IMPLANTS; ULTRASOUND
RIGHT BREAST LIMITED
TECHNIQUE: The images were evaluated with computer-aided detection.; Targeted
ultrasound examination of the right breast was performed

[Series 1: us breast*right* limited inc axilla · 0.06mm/px · 6 of 6 slices shown]
[im 1/6]
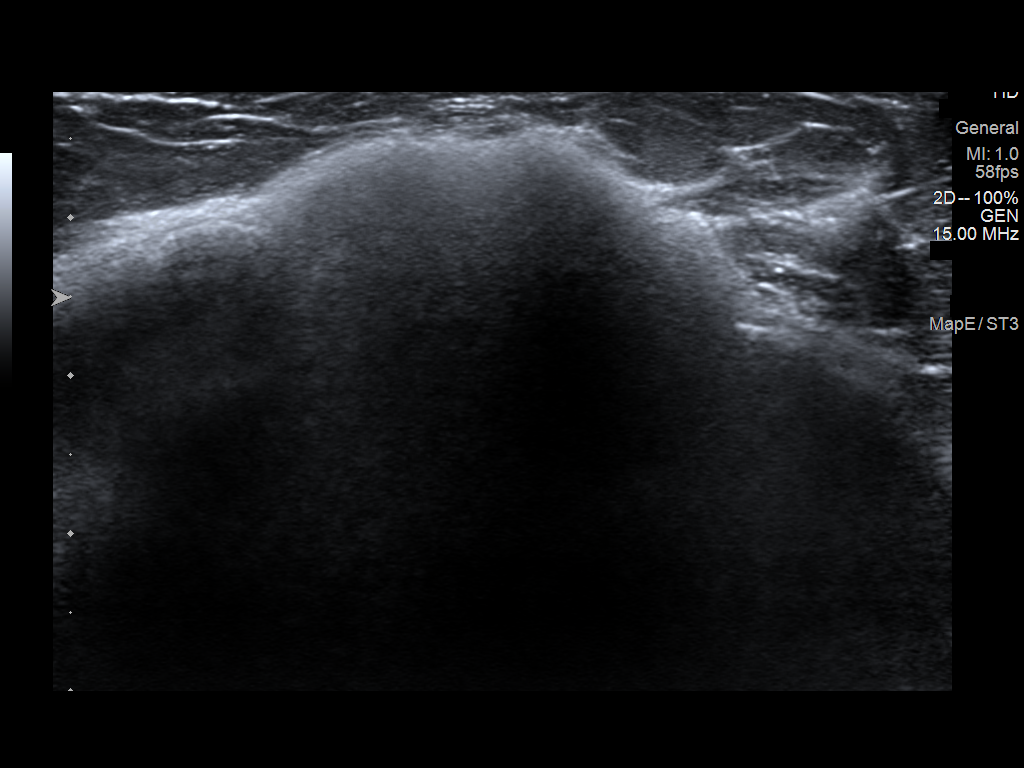
[im 2/6]
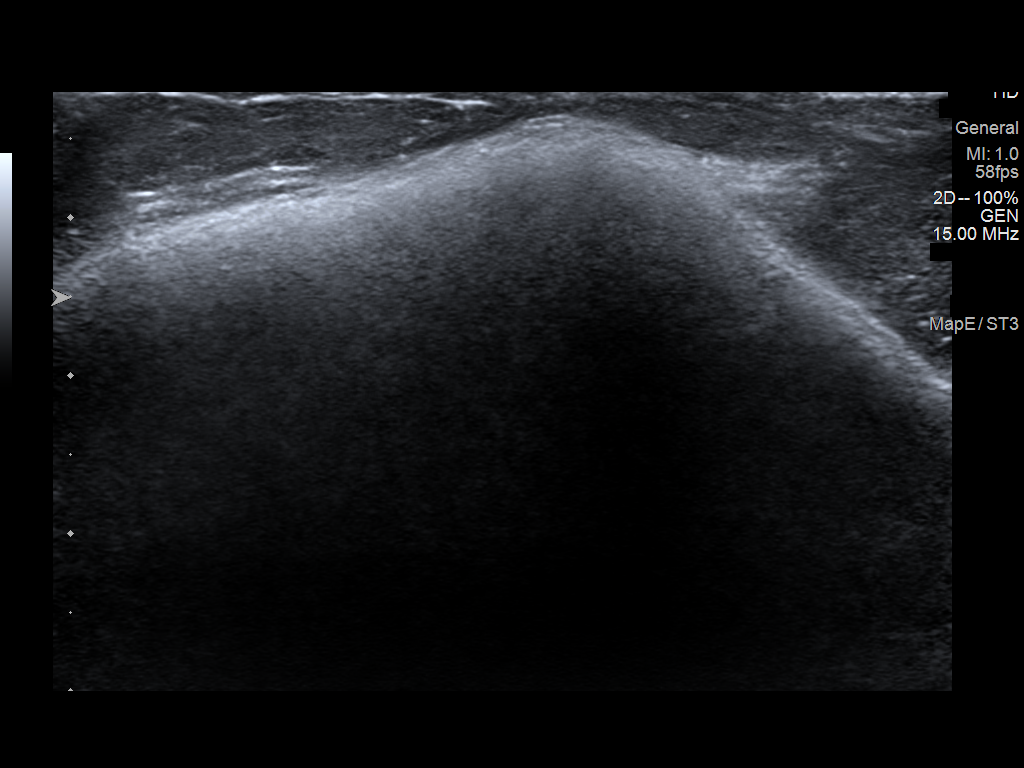
[im 3/6]
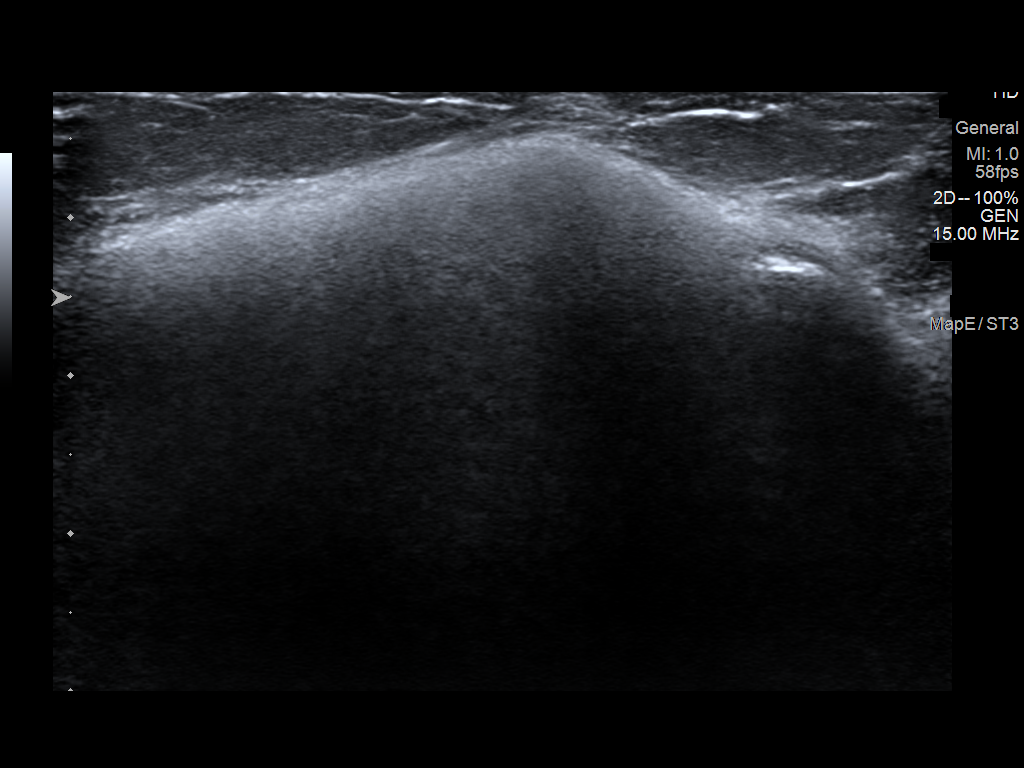
[im 4/6]
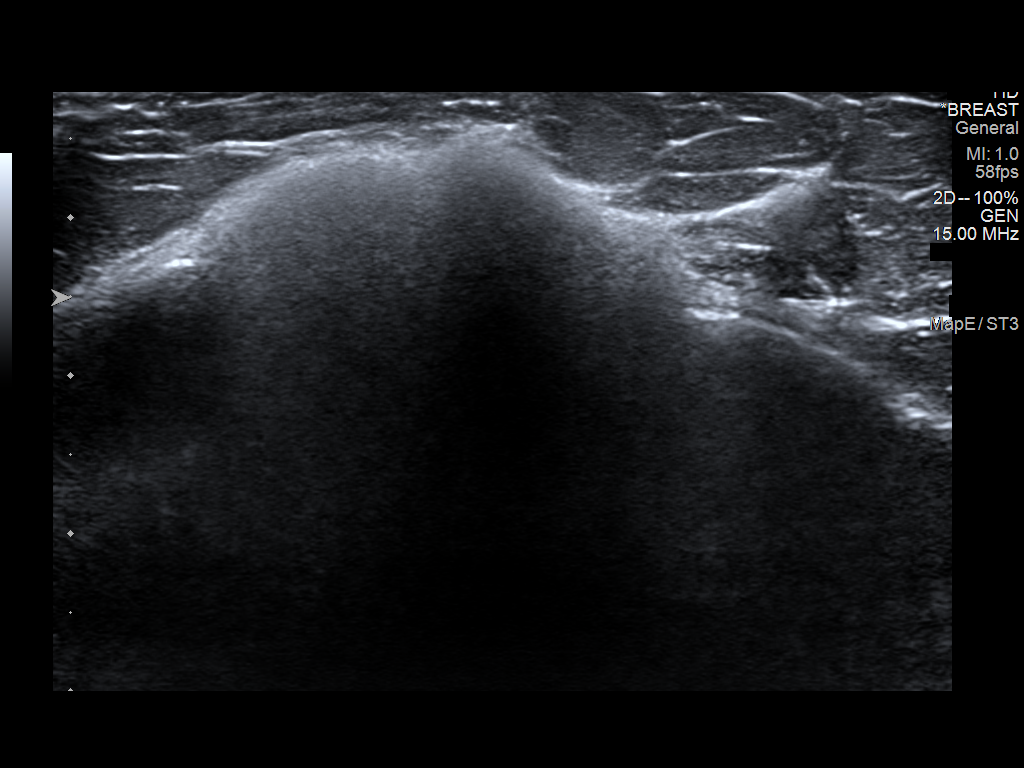
[im 5/6]
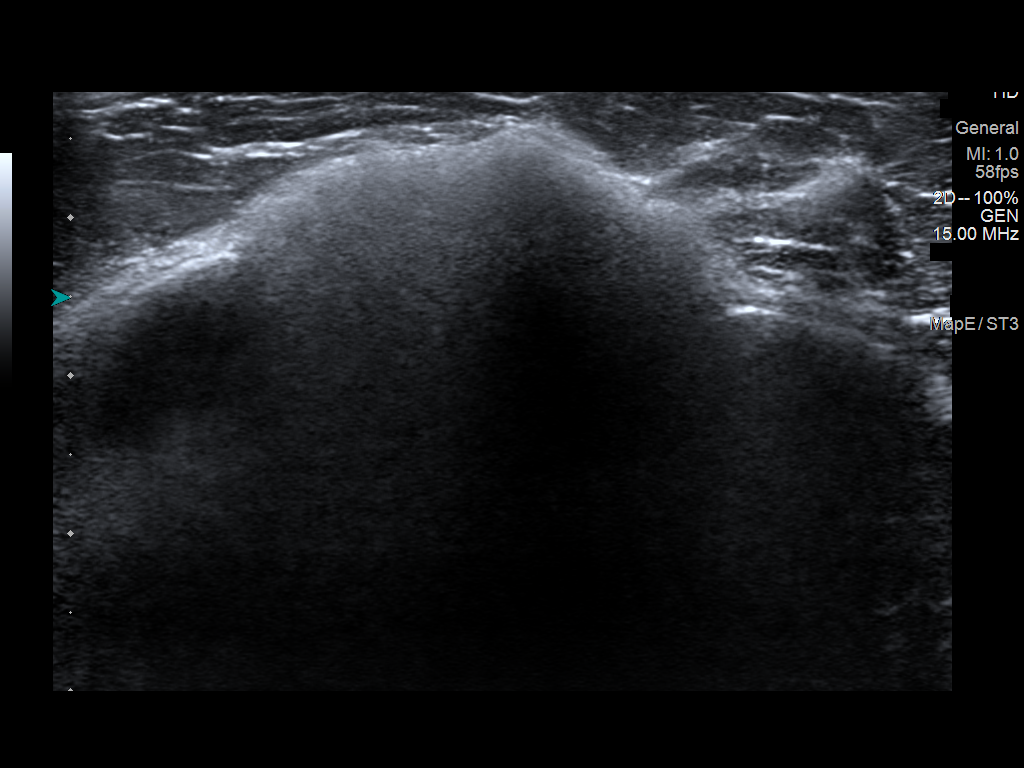
[im 6/6]
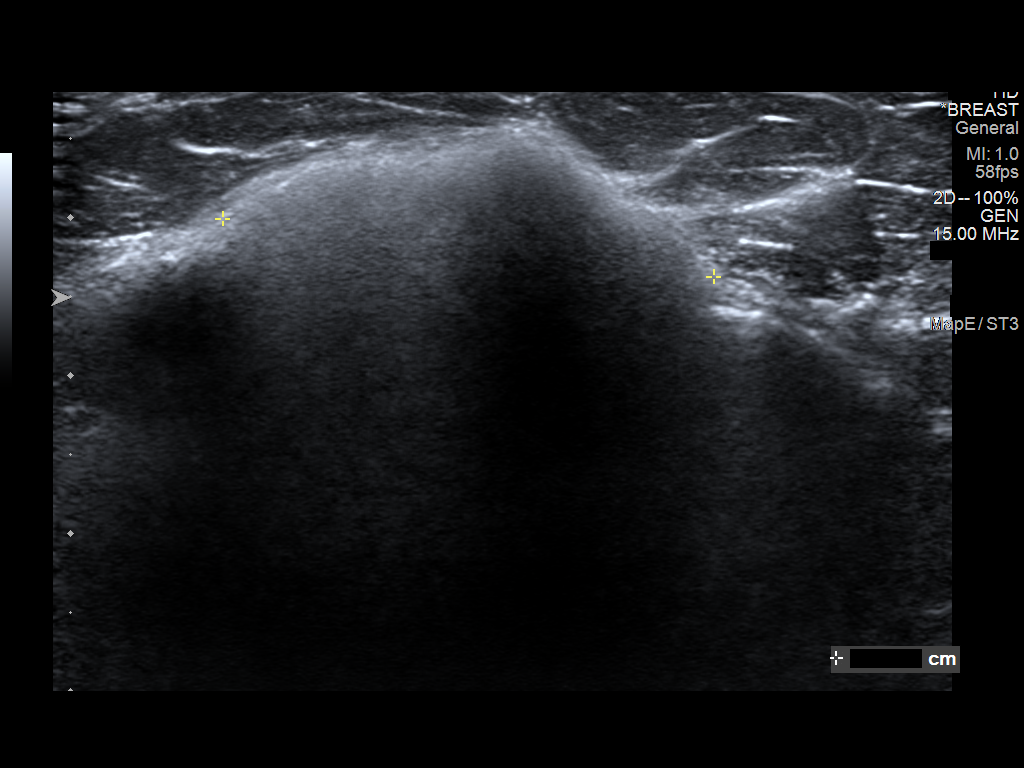

[6 of 6 positions shown; findings below may reference images not displayed]

ACR Breast Density Category b: There are scattered areas of
fibroglandular density.
FINDINGS: The patient has bilateral retroglandular silicone implants. Extra
capsular rupture of the RIGHT implant is noted. Equivocal
intracapsular rupture of the LEFT implant noted.

No suspicious mammographic abnormalities noted.

On physical exam, focal thickening at the 3 o'clock position of the
RIGHT breast 10 cm from the nipple is noted.

Targeted ultrasound is performed, showing extracapsular silicone at
the 3 o'clock position of the RIGHT breast 10 cm from the nipple,
accounting for the palpable thickening.
IMPRESSION: 1. Extracapsular rupture of RIGHT retroglandular silicone implant,
with palpable INNER RIGHT breast thickening representing extra
capsular silicone.
2. Equivocal intracapsular rupture of the LEFT retroglandular
silicone implant.
3. No mammographic evidence of breast malignancy.

RECOMMENDATION:
Consider clinical follow-up as indicated. Any further workup should
be based on clinical grounds.

Bilateral screening mammogram in 1 year.

I have discussed the findings and recommendations with the patient.
If applicable, a reminder letter will be sent to the patient
regarding the next appointment.

BI-RADS CATEGORY  2: Benign.

## 2021-10-26 DIAGNOSIS — M5416 Radiculopathy, lumbar region: Secondary | ICD-10-CM | POA: Diagnosis not present

## 2021-10-26 DIAGNOSIS — Z131 Encounter for screening for diabetes mellitus: Secondary | ICD-10-CM | POA: Diagnosis not present

## 2021-10-26 DIAGNOSIS — Z0000 Encounter for general adult medical examination without abnormal findings: Secondary | ICD-10-CM | POA: Diagnosis not present

## 2021-10-26 DIAGNOSIS — M899 Disorder of bone, unspecified: Secondary | ICD-10-CM | POA: Diagnosis not present

## 2021-10-26 DIAGNOSIS — R69 Illness, unspecified: Secondary | ICD-10-CM | POA: Diagnosis not present

## 2021-10-26 DIAGNOSIS — E7800 Pure hypercholesterolemia, unspecified: Secondary | ICD-10-CM | POA: Diagnosis not present

## 2021-10-26 DIAGNOSIS — E559 Vitamin D deficiency, unspecified: Secondary | ICD-10-CM | POA: Diagnosis not present

## 2021-10-26 DIAGNOSIS — Z86010 Personal history of colonic polyps: Secondary | ICD-10-CM | POA: Diagnosis not present

## 2021-10-26 DIAGNOSIS — G722 Myopathy due to other toxic agents: Secondary | ICD-10-CM | POA: Diagnosis not present

## 2021-10-26 DIAGNOSIS — Z789 Other specified health status: Secondary | ICD-10-CM | POA: Diagnosis not present

## 2021-10-26 DIAGNOSIS — K219 Gastro-esophageal reflux disease without esophagitis: Secondary | ICD-10-CM | POA: Diagnosis not present

## 2021-11-30 DIAGNOSIS — H2513 Age-related nuclear cataract, bilateral: Secondary | ICD-10-CM | POA: Diagnosis not present

## 2022-01-19 DIAGNOSIS — H269 Unspecified cataract: Secondary | ICD-10-CM | POA: Diagnosis not present

## 2022-01-19 DIAGNOSIS — H2511 Age-related nuclear cataract, right eye: Secondary | ICD-10-CM | POA: Diagnosis not present

## 2022-02-02 DIAGNOSIS — H269 Unspecified cataract: Secondary | ICD-10-CM | POA: Diagnosis not present

## 2022-02-02 DIAGNOSIS — H2512 Age-related nuclear cataract, left eye: Secondary | ICD-10-CM | POA: Diagnosis not present

## 2022-02-09 DIAGNOSIS — R519 Headache, unspecified: Secondary | ICD-10-CM | POA: Diagnosis not present

## 2022-02-09 DIAGNOSIS — R112 Nausea with vomiting, unspecified: Secondary | ICD-10-CM | POA: Diagnosis not present

## 2022-02-09 DIAGNOSIS — Z03818 Encounter for observation for suspected exposure to other biological agents ruled out: Secondary | ICD-10-CM | POA: Diagnosis not present

## 2022-02-09 DIAGNOSIS — R5381 Other malaise: Secondary | ICD-10-CM | POA: Diagnosis not present

## 2022-03-10 DIAGNOSIS — H35352 Cystoid macular degeneration, left eye: Secondary | ICD-10-CM | POA: Diagnosis not present

## 2022-03-31 DIAGNOSIS — H35352 Cystoid macular degeneration, left eye: Secondary | ICD-10-CM | POA: Diagnosis not present

## 2022-04-01 ENCOUNTER — Ambulatory Visit (HOSPITAL_COMMUNITY)
Admission: EM | Admit: 2022-04-01 | Discharge: 2022-04-01 | Disposition: A | Discharge status: Home / Self Care | Payer: Medicare HMO | Attending: Emergency Medicine | Admitting: Emergency Medicine

## 2022-04-01 ENCOUNTER — Encounter (HOSPITAL_COMMUNITY): Payer: Self-pay

## 2022-04-01 DIAGNOSIS — R051 Acute cough: Secondary | ICD-10-CM | POA: Diagnosis not present

## 2022-04-01 MED ORDER — BENZONATATE 100 MG PO CAPS: 100.0000 mg | ORAL_CAPSULE | Freq: Three times a day (TID) | ORAL | 0 refills | Status: AC | PRN

## 2022-04-01 MED ORDER — PREDNISONE 10 MG PO TABS: 10.0000 mg | ORAL_TABLET | Freq: Every day | ORAL | 0 refills | Status: AC

## 2022-04-01 NOTE — Discharge Instructions (Signed)
Continue delsym Add tessalon - three times daily Prednisone once daily as prescribed Drink lots of fluids! Also try honey or elderberry syrup for cough

## 2022-04-01 NOTE — ED Triage Notes (Signed)
Pt states cough for one week. Taking Delsym at home.

## 2022-04-01 NOTE — ED Provider Notes (Signed)
[place]    CSN: [id number] Arrival date & time: 04/01/22  1235      History   Chief Complaint Chief Complaint  Patient presents with   Cough    HPI [name] is a 77 y.o. female.  Presents with 1 week history of cough Somewhat productive Denies any nasal congestion, runny nose, fever, chills Tolerating fluids Has been using Delsym with some relief  Possible sick contact Denies history of lung problems.  She quit smoking about 40 years ago  Past Medical History:  Diagnosis Date   Constipation    GERD (gastroesophageal reflux disease)     Patient Active Problem List   Diagnosis Date Noted   Ruptured silicone breast implant 08/20/2020   Quadriceps strain, left, initial encounter 05/26/2019    Past Surgical History:  Procedure Laterality Date   BACK SURGERY     CHOLECYSTECTOMY      OB History   No obstetric history on file.      Home Medications    Prior to Admission medications   Medication Sig Start Date End Date Taking? Authorizing Provider  benzonatate (TESSALON) 100 MG capsule Take 1 capsule (100 mg total) by mouth 3 (three) times daily as needed. 04/01/22  Yes Demonte Dobratz, Wells Guiles, PA-C  predniSONE (DELTASONE) 10 MG tablet Take 1 tablet (10 mg total) by mouth daily with breakfast for 5 days. 04/01/22 04/06/22 Yes Marlis Oldaker, Wells Guiles, PA-C  bisacodyl (DULCOLAX) 5 MG EC tablet Take 5 mg by mouth daily as needed for moderate constipation.    [provider]  buPROPion (WELLBUTRIN XL) 150 MG 24 hr tablet Take 150 mg by mouth every morning. 10/25/20   [provider]  cholecalciferol (VITAMIN D3) 25 MCG (1000 UNIT) tablet Take 1,000 Units by mouth daily.    [provider]  ibuprofen (ADVIL) 200 MG tablet Take 200 mg by mouth in the morning and at bedtime.    [provider]  magnesium gluconate (MAGONATE) 500 MG tablet Take 500 mg by mouth daily.    [provider]  nabumetone (RELAFEN) 500 MG tablet  Take 500 mg by mouth daily.    [provider]  pantoprazole (PROTONIX) 40 MG tablet Take 40 mg by mouth daily.    [provider]  pregabalin (LYRICA) 150 MG capsule Take 150 mg by mouth 2 (two) times daily.    [provider]    Family History History reviewed. No pertinent family history.  Social History Social History   Tobacco Use   Smoking status: Former   Smokeless tobacco: Never  Substance Use Topics   Alcohol use: Yes    Comment: occ   Drug use: Never     Allergies   Morphine and related   Review of Systems Review of Systems As per HPI  Physical Exam Triage Vital Signs ED Triage Vitals  Enc Vitals Group     BP 04/01/22 1427 (!) 170/81     Pulse Rate 04/01/22 1427 99     Resp 04/01/22 1425 16     Temp 04/01/22 1425 (!) 97.4 F (36.3 C)     Temp Source 04/01/22 1425 Oral     SpO2 04/01/22 1427 97 %     Weight --      Height --      Head Circumference --      Peak Flow --      Pain Score 04/01/22 1425 0     Pain Loc --  Pain Edu? --      Excl. in [place]? --    No data found.  Updated Vital Signs BP (!) 170/81 (BP Location: Left Arm)   Pulse 99   Temp (!) 97.4 F (36.3 C) (Oral)   Resp 18   SpO2 97%     Physical Exam Vitals and nursing note reviewed.  Constitutional:      General: She is not in acute distress. HENT:     Nose: No congestion or rhinorrhea.     Mouth/Throat:     Mouth: Mucous membranes are moist.     Pharynx: Oropharynx is clear. No posterior oropharyngeal erythema.  Eyes:     Conjunctiva/sclera: Conjunctivae normal.  Cardiovascular:     Rate and Rhythm: Normal rate and regular rhythm.     Pulses: Normal pulses.     Heart sounds: Normal heart sounds.  Pulmonary:     Effort: Pulmonary effort is normal.     Breath sounds: Normal breath sounds.     Comments: Clear throughout Musculoskeletal:     Cervical back: Normal range of motion.  Lymphadenopathy:     Cervical: No cervical adenopathy.   Skin:    General: Skin is warm and dry.  Neurological:     Mental Status: She is alert and oriented to person, place, and time.      UC Treatments / Results  Labs (all labs ordered are listed, but only abnormal results are displayed) Labs Reviewed - No data to display  EKG   Radiology No results found.  Procedures Procedures (including critical care time)  Medications Ordered in UC Medications - No data to display  Initial Impression / Assessment and Plan / UC Course  I have reviewed the triage vital signs and the nursing notes.  Pertinent labs & imaging results that were available during my care of the patient were reviewed by me and considered in my medical decision making (see chart for details).  Afebrile, well-appearing, clear lungs Discussed likely viral etiology, symptomatic care Sent Tessalon to use 3 times daily Will do short course prednisone, 10 mg daily for 5 days Continue delsym Return precautions discussed. Patient agrees to plan  Final Clinical Impressions(s) / UC Diagnoses   Final diagnoses:  Acute cough     Discharge Instructions      Continue delsym Add tessalon - three times daily Prednisone once daily as prescribed Drink lots of fluids! Also try honey or elderberry syrup for cough    ED Prescriptions     Medication Sig Dispense Auth. Provider   predniSONE (DELTASONE) 10 MG tablet Take 1 tablet (10 mg total) by mouth daily with breakfast for 5 days. 5 tablet Lateefah Mallery, PA-C   benzonatate (TESSALON) 100 MG capsule Take 1 capsule (100 mg total) by mouth 3 (three) times daily as needed. 21 capsule Myanna Ziesmer, Wells Guiles, PA-C      PDMP not reviewed this encounter.   Les Pou, [place] 04/01/22 1539

## 2022-10-30 ENCOUNTER — Other Ambulatory Visit: Payer: Self-pay | Admitting: Family Medicine

## 2022-10-30 DIAGNOSIS — F321 Major depressive disorder, single episode, moderate: Secondary | ICD-10-CM | POA: Diagnosis not present

## 2022-10-30 DIAGNOSIS — M5416 Radiculopathy, lumbar region: Secondary | ICD-10-CM | POA: Diagnosis not present

## 2022-10-30 DIAGNOSIS — E7800 Pure hypercholesterolemia, unspecified: Secondary | ICD-10-CM | POA: Diagnosis not present

## 2022-10-30 DIAGNOSIS — K219 Gastro-esophageal reflux disease without esophagitis: Secondary | ICD-10-CM | POA: Diagnosis not present

## 2022-10-30 DIAGNOSIS — M899 Disorder of bone, unspecified: Secondary | ICD-10-CM | POA: Diagnosis not present

## 2022-10-30 DIAGNOSIS — E559 Vitamin D deficiency, unspecified: Secondary | ICD-10-CM | POA: Diagnosis not present

## 2022-10-30 DIAGNOSIS — M8580 Other specified disorders of bone density and structure, unspecified site: Secondary | ICD-10-CM

## 2022-10-30 DIAGNOSIS — Z0000 Encounter for general adult medical examination without abnormal findings: Secondary | ICD-10-CM | POA: Diagnosis not present

## 2022-10-30 DIAGNOSIS — G720 Drug-induced myopathy: Secondary | ICD-10-CM | POA: Diagnosis not present

## 2022-10-30 DIAGNOSIS — Z23 Encounter for immunization: Secondary | ICD-10-CM | POA: Diagnosis not present

## 2022-10-30 DIAGNOSIS — Z9181 History of falling: Secondary | ICD-10-CM | POA: Diagnosis not present

## 2022-10-31 ENCOUNTER — Ambulatory Visit
Admission: RE | Admit: 2022-10-31 | Discharge: 2022-10-31 | Disposition: A | Discharge status: Home / Self Care | Payer: Medicare HMO | Source: Ambulatory Visit | Attending: Family Medicine | Admitting: Family Medicine

## 2022-10-31 DIAGNOSIS — M8580 Other specified disorders of bone density and structure, unspecified site: Secondary | ICD-10-CM

## 2022-10-31 DIAGNOSIS — N958 Other specified menopausal and perimenopausal disorders: Secondary | ICD-10-CM | POA: Diagnosis not present

## 2022-10-31 DIAGNOSIS — E349 Endocrine disorder, unspecified: Secondary | ICD-10-CM | POA: Diagnosis not present

## 2022-10-31 DIAGNOSIS — M8588 Other specified disorders of bone density and structure, other site: Secondary | ICD-10-CM | POA: Diagnosis not present

## 2022-11-03 ENCOUNTER — Other Ambulatory Visit: Payer: Self-pay | Admitting: Family Medicine

## 2022-11-03 DIAGNOSIS — M8580 Other specified disorders of bone density and structure, unspecified site: Secondary | ICD-10-CM

## 2022-11-23 DIAGNOSIS — U071 COVID-19: Secondary | ICD-10-CM | POA: Diagnosis not present

## 2022-11-23 DIAGNOSIS — R051 Acute cough: Secondary | ICD-10-CM | POA: Diagnosis not present

## 2023-04-06 DIAGNOSIS — K219 Gastro-esophageal reflux disease without esophagitis: Secondary | ICD-10-CM | POA: Diagnosis not present

## 2023-04-06 DIAGNOSIS — F321 Major depressive disorder, single episode, moderate: Secondary | ICD-10-CM | POA: Diagnosis not present

## 2023-04-06 DIAGNOSIS — M5416 Radiculopathy, lumbar region: Secondary | ICD-10-CM | POA: Diagnosis not present

## 2023-08-15 ENCOUNTER — Emergency Department (HOSPITAL_COMMUNITY)
Admission: EM | Admit: 2023-08-15 | Discharge: 2023-08-15 | Disposition: A | Discharge status: Home / Self Care | Attending: Emergency Medicine | Admitting: Emergency Medicine

## 2023-08-15 ENCOUNTER — Other Ambulatory Visit: Payer: Self-pay

## 2023-08-15 ENCOUNTER — Encounter (HOSPITAL_COMMUNITY): Payer: Self-pay

## 2023-08-15 ENCOUNTER — Emergency Department (HOSPITAL_COMMUNITY)

## 2023-08-15 DIAGNOSIS — R918 Other nonspecific abnormal finding of lung field: Secondary | ICD-10-CM | POA: Diagnosis not present

## 2023-08-15 DIAGNOSIS — R042 Hemoptysis: Secondary | ICD-10-CM | POA: Diagnosis not present

## 2023-08-15 DIAGNOSIS — J189 Pneumonia, unspecified organism: Secondary | ICD-10-CM | POA: Insufficient documentation

## 2023-08-15 DIAGNOSIS — I700 Atherosclerosis of aorta: Secondary | ICD-10-CM | POA: Diagnosis not present

## 2023-08-15 DIAGNOSIS — R059 Cough, unspecified: Secondary | ICD-10-CM | POA: Diagnosis present

## 2023-08-15 DIAGNOSIS — R9389 Abnormal findings on diagnostic imaging of other specified body structures: Secondary | ICD-10-CM | POA: Diagnosis not present

## 2023-08-15 LAB — CBC WITH DIFFERENTIAL/PLATELET
Abs Immature Granulocytes: 0.04 K/uL (ref 0.00–0.07)
Basophils Absolute: 0 K/uL (ref 0.0–0.1)
Basophils Relative: 1 %
Eosinophils Absolute: 0 K/uL (ref 0.0–0.5)
Eosinophils Relative: 0 %
HCT: 37.8 % (ref 36.0–46.0)
Hemoglobin: 12.2 g/dL (ref 12.0–15.0)
Immature Granulocytes: 1 %
Lymphocytes Relative: 15 %
Lymphs Abs: 1.1 K/uL (ref 0.7–4.0)
MCH: 30.2 pg (ref 26.0–34.0)
MCHC: 32.3 g/dL (ref 30.0–36.0)
MCV: 93.6 fL (ref 80.0–100.0)
Monocytes Absolute: 0.5 K/uL (ref 0.1–1.0)
Monocytes Relative: 7 %
Neutro Abs: 5.4 K/uL (ref 1.7–7.7)
Neutrophils Relative %: 76 %
Platelets: 292 K/uL (ref 150–400)
RBC: 4.04 MIL/uL (ref 3.87–5.11)
RDW: 13.2 % (ref 11.5–15.5)
WBC: 7.1 K/uL (ref 4.0–10.5)
nRBC: 0 % (ref 0.0–0.2)

## 2023-08-15 LAB — COMPREHENSIVE METABOLIC PANEL WITH GFR
ALT: 30 U/L (ref 0–44)
AST: 47 U/L — ABNORMAL HIGH (ref 15–41)
Albumin: 3.5 g/dL (ref 3.5–5.0)
Alkaline Phosphatase: 74 U/L (ref 38–126)
Anion gap: 12 (ref 5–15)
BUN: 15 mg/dL (ref 8–23)
CO2: 24 mmol/L (ref 22–32)
Calcium: 9.5 mg/dL (ref 8.9–10.3)
Chloride: 107 mmol/L (ref 98–111)
Creatinine, Ser: 1 mg/dL (ref 0.44–1.00)
GFR, Estimated: 58 mL/min — ABNORMAL LOW (ref >60)
Glucose, Bld: 94 mg/dL (ref 70–99)
Potassium: 4.1 mmol/L (ref 3.5–5.1)
Sodium: 143 mmol/L (ref 135–145)
Total Bilirubin: 0.7 mg/dL (ref 0.0–1.2)
Total Protein: 7.1 g/dL (ref 6.5–8.1)

## 2023-08-15 MED ORDER — AZITHROMYCIN 250 MG PO TABS
500.0000 mg | ORAL_TABLET | Freq: Once | ORAL | Status: AC
  Administered 2023-08-15: 500 mg via ORAL
  Filled 2023-08-15: qty 2

## 2023-08-15 MED ORDER — AZITHROMYCIN 250 MG PO TABS: 250.0000 mg | ORAL_TABLET | Freq: Every day | ORAL | 0 refills | Status: AC

## 2023-08-15 MED ORDER — AMOXICILLIN-POT CLAVULANATE 875-125 MG PO TABS
1.0000 | ORAL_TABLET | Freq: Once | ORAL | Status: AC
  Administered 2023-08-15: 1 via ORAL
  Filled 2023-08-15: qty 1

## 2023-08-15 MED ORDER — IOHEXOL 350 MG/ML SOLN
75.0000 mL | Freq: Once | INTRAVENOUS | Status: AC | PRN
  Administered 2023-08-15: 75 mL via INTRAVENOUS

## 2023-08-15 MED ORDER — AMOXICILLIN-POT CLAVULANATE 875-125 MG PO TABS: 1.0000 | ORAL_TABLET | Freq: Two times a day (BID) | ORAL | 0 refills | Status: AC

## 2023-08-15 NOTE — ED Triage Notes (Addendum)
 Pt reports spitting up bright red blood since Thursday. Pt was seen at [facility] today and they are concerned about the results. Pt was told to come here. Pt denies pain but has been weaker today and not feeling as well.

## 2023-08-15 NOTE — ED Provider Notes (Signed)
 [facility] EM[facility] DEPARTMENT AT [facility] Provider Note   CSN: [id number] Arrival date & time: 08/15/23  1406     Patient presents with: Hemoptysis   [name] is a 78 y.o. female.   HPI Patient presents with cough and a mopped assist.  Today is Wednesday and has been coughing up since Thursday.  Spit up a total of around 20 cc.  Went to [facility] urgent care today and found a density on the right lung x-ray.  Patient has been losing weight.  Non-smoker.  Also just feeling bad all over.  No other bleeding.  Not on blood thinners.   Past Medical History:  Diagnosis Date   Constipation    GERD (gastroesophageal reflux disease)     Prior to Admission medications   Medication Sig Start Date End Date Taking? Authorizing Provider  benzonatate  (TESSALON ) 100 MG capsule Take 1 capsule (100 mg total) by mouth 3 (three) times daily as needed. 04/01/22   Rising, [place], PA-C  bisacodyl (DULCOLAX) 5 MG EC tablet Take 5 mg by mouth daily as needed for moderate constipation.    [provider]  buPROPion (WELLBUTRIN XL) 150 MG 24 hr tablet Take 150 mg by mouth every morning. 10/25/20   [provider]  cholecalciferol (VITAMIN D3) 25 MCG (1000 UNIT) tablet Take 1,000 Units by mouth daily.    [provider]  ibuprofen (ADVIL) 200 MG tablet Take 200 mg by mouth in the morning and at bedtime.    [provider]  magnesium gluconate (MAGONATE) 500 MG tablet Take 500 mg by mouth daily.    [provider]  nabumetone (RELAFEN) 500 MG tablet Take 500 mg by mouth daily.    [provider]  pantoprazole (PROTONIX) 40 MG tablet Take 40 mg by mouth daily.    [provider]  pregabalin (LYRICA) 150 MG capsule Take 150 mg by mouth 2 (two) times daily.    [provider]    Allergies: Morphine and codeine    Review of Systems  Updated Vital Signs BP 113/73   Pulse 72   Temp 98.3 F (36.8 C) (Oral)   Resp  18   Ht 5' 9.5 (1.765 m)   Wt 63.3 kg   SpO2 95%   BMI 20.32 kg/m   Physical Exam Vitals and nursing note reviewed.  Cardiovascular:     Rate and Rhythm: Regular rhythm.  Pulmonary:     Breath sounds: No wheezing.  Abdominal:     Tenderness: There is no abdominal tenderness.  Musculoskeletal:     Cervical back: Neck supple.     Right lower leg: No edema.     Left lower leg: No edema.  Skin:    General: Skin is warm.  Neurological:     Mental Status: She is alert and oriented to person, place, and time.     (all labs ordered are listed, but only abnormal results are displayed) Labs Reviewed  COMPREHENSIVE METABOLIC PANEL WITH GFR - Abnormal; Notable for the following components:      Result Value   AST 47 (*)    GFR, Estimated 58 (*)    All other components within normal limits  CBC WITH DIFFERENTIAL/PLATELET  TYPE AND SCREEN    EKG: None  Radiology: No results found.   Procedures   Medications Ordered in the ED - No data to display  Medical Decision Making Amount and/or Complexity of Data Reviewed Labs: ordered. Radiology: ordered.   Patient with hemoptysis and density on x-ray.  Differential diagnosis includes pneumonia, pulmonary embolism mass.  Has had some weight loss.  Basic blood work reassuring.  Hemoglobin reassuring.  Will get CTA to evaluate for PE and mass.  Independently interpreted the x-ray from urgent care does show the right side density.      Final diagnoses:  None    ED Discharge Orders     None          Patsey Lot, MD 08/15/23 2302

## 2023-08-23 DIAGNOSIS — J189 Pneumonia, unspecified organism: Secondary | ICD-10-CM | POA: Diagnosis not present

## 2023-09-13 DIAGNOSIS — J189 Pneumonia, unspecified organism: Secondary | ICD-10-CM | POA: Diagnosis not present
